# Patient Record
Sex: Male | Born: 1950 | Race: Asian | Hispanic: No | Marital: Married
Health system: Southern US, Community
[De-identification: ages and names within clinical notes are randomized; demographics above are authoritative.]

---

## 2000-10-02 ENCOUNTER — Encounter: Admission: RE | Admit: 2000-10-02 | Discharge: 2000-12-31 | Payer: Self-pay | Admitting: Internal Medicine

## 2014-01-30 ENCOUNTER — Other Ambulatory Visit (HOSPITAL_COMMUNITY): Payer: Self-pay | Admitting: Cardiology

## 2014-01-30 DIAGNOSIS — R071 Chest pain on breathing: Secondary | ICD-10-CM

## 2014-02-06 ENCOUNTER — Encounter (HOSPITAL_COMMUNITY)
Admission: RE | Admit: 2014-02-06 | Discharge: 2014-02-06 | Disposition: A | Discharge status: Home / Self Care | Payer: Managed Care, Other (non HMO) | Source: Ambulatory Visit | Attending: Cardiology | Admitting: Cardiology

## 2014-02-06 ENCOUNTER — Ambulatory Visit (HOSPITAL_COMMUNITY): Payer: Self-pay

## 2014-02-06 VITALS — BP 126/71 | HR 93

## 2014-02-06 DIAGNOSIS — R071 Chest pain on breathing: Secondary | ICD-10-CM

## 2014-02-06 MED ORDER — TECHNETIUM TC 99M SESTAMIBI GENERIC - CARDIOLITE
10.0000 | Freq: Once | INTRAVENOUS | Status: AC | PRN
  Administered 2014-02-06: 10 via INTRAVENOUS

## 2014-02-06 MED ORDER — TECHNETIUM TC 99M SESTAMIBI GENERIC - CARDIOLITE
30.0000 | Freq: Once | INTRAVENOUS | Status: AC | PRN
  Administered 2014-02-06: 30 via INTRAVENOUS

## 2015-01-28 ENCOUNTER — Other Ambulatory Visit: Payer: Self-pay | Admitting: Internal Medicine

## 2015-01-28 ENCOUNTER — Ambulatory Visit
Admission: RE | Admit: 2015-01-28 | Discharge: 2015-01-28 | Disposition: A | Discharge status: Home / Self Care | Payer: Managed Care, Other (non HMO) | Source: Ambulatory Visit | Attending: Internal Medicine | Admitting: Internal Medicine

## 2015-01-28 DIAGNOSIS — M25551 Pain in right hip: Secondary | ICD-10-CM

## 2015-01-28 IMAGING — CR DG HIP (WITH OR WITHOUT PELVIS) 2-3V*R*
2 series · 2 of 2 positions shown · non-contrast
Comparison: None.

CLINICAL DATA: Right hip pain, fall [DATE]

EXAM:
DG HIP (WITH OR WITHOUT PELVIS) 2-3V RIGHT

[w pelvis]
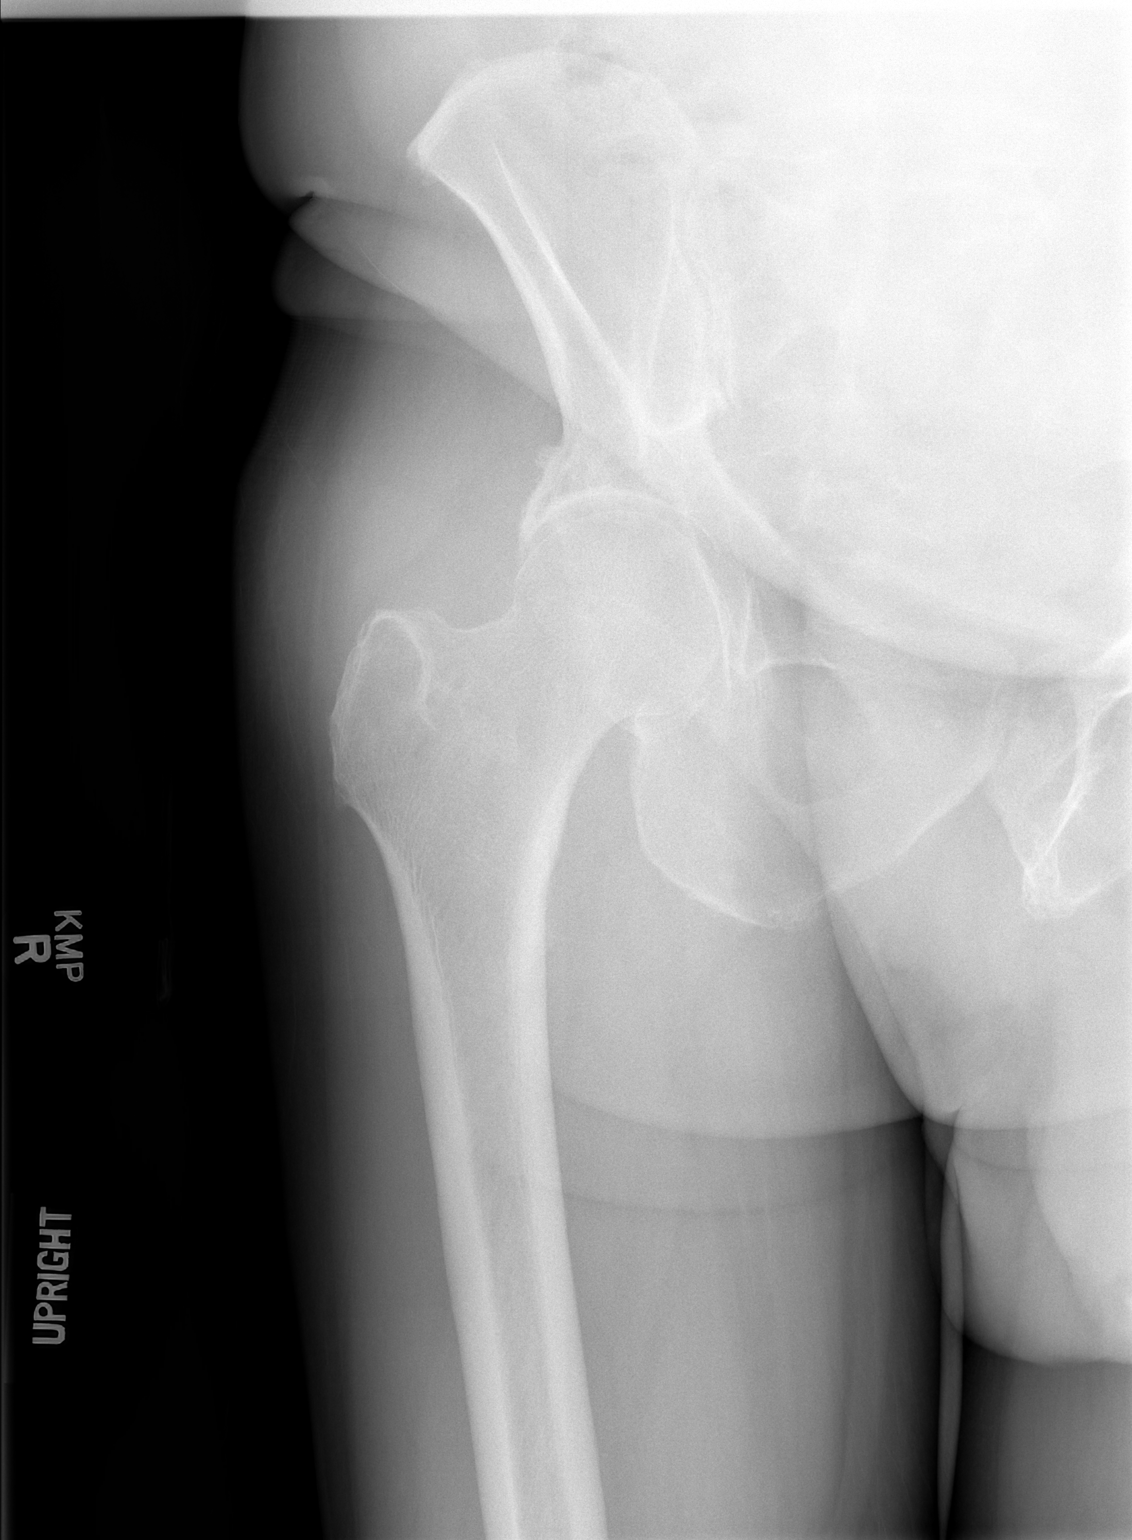

[t hip frog leg right]
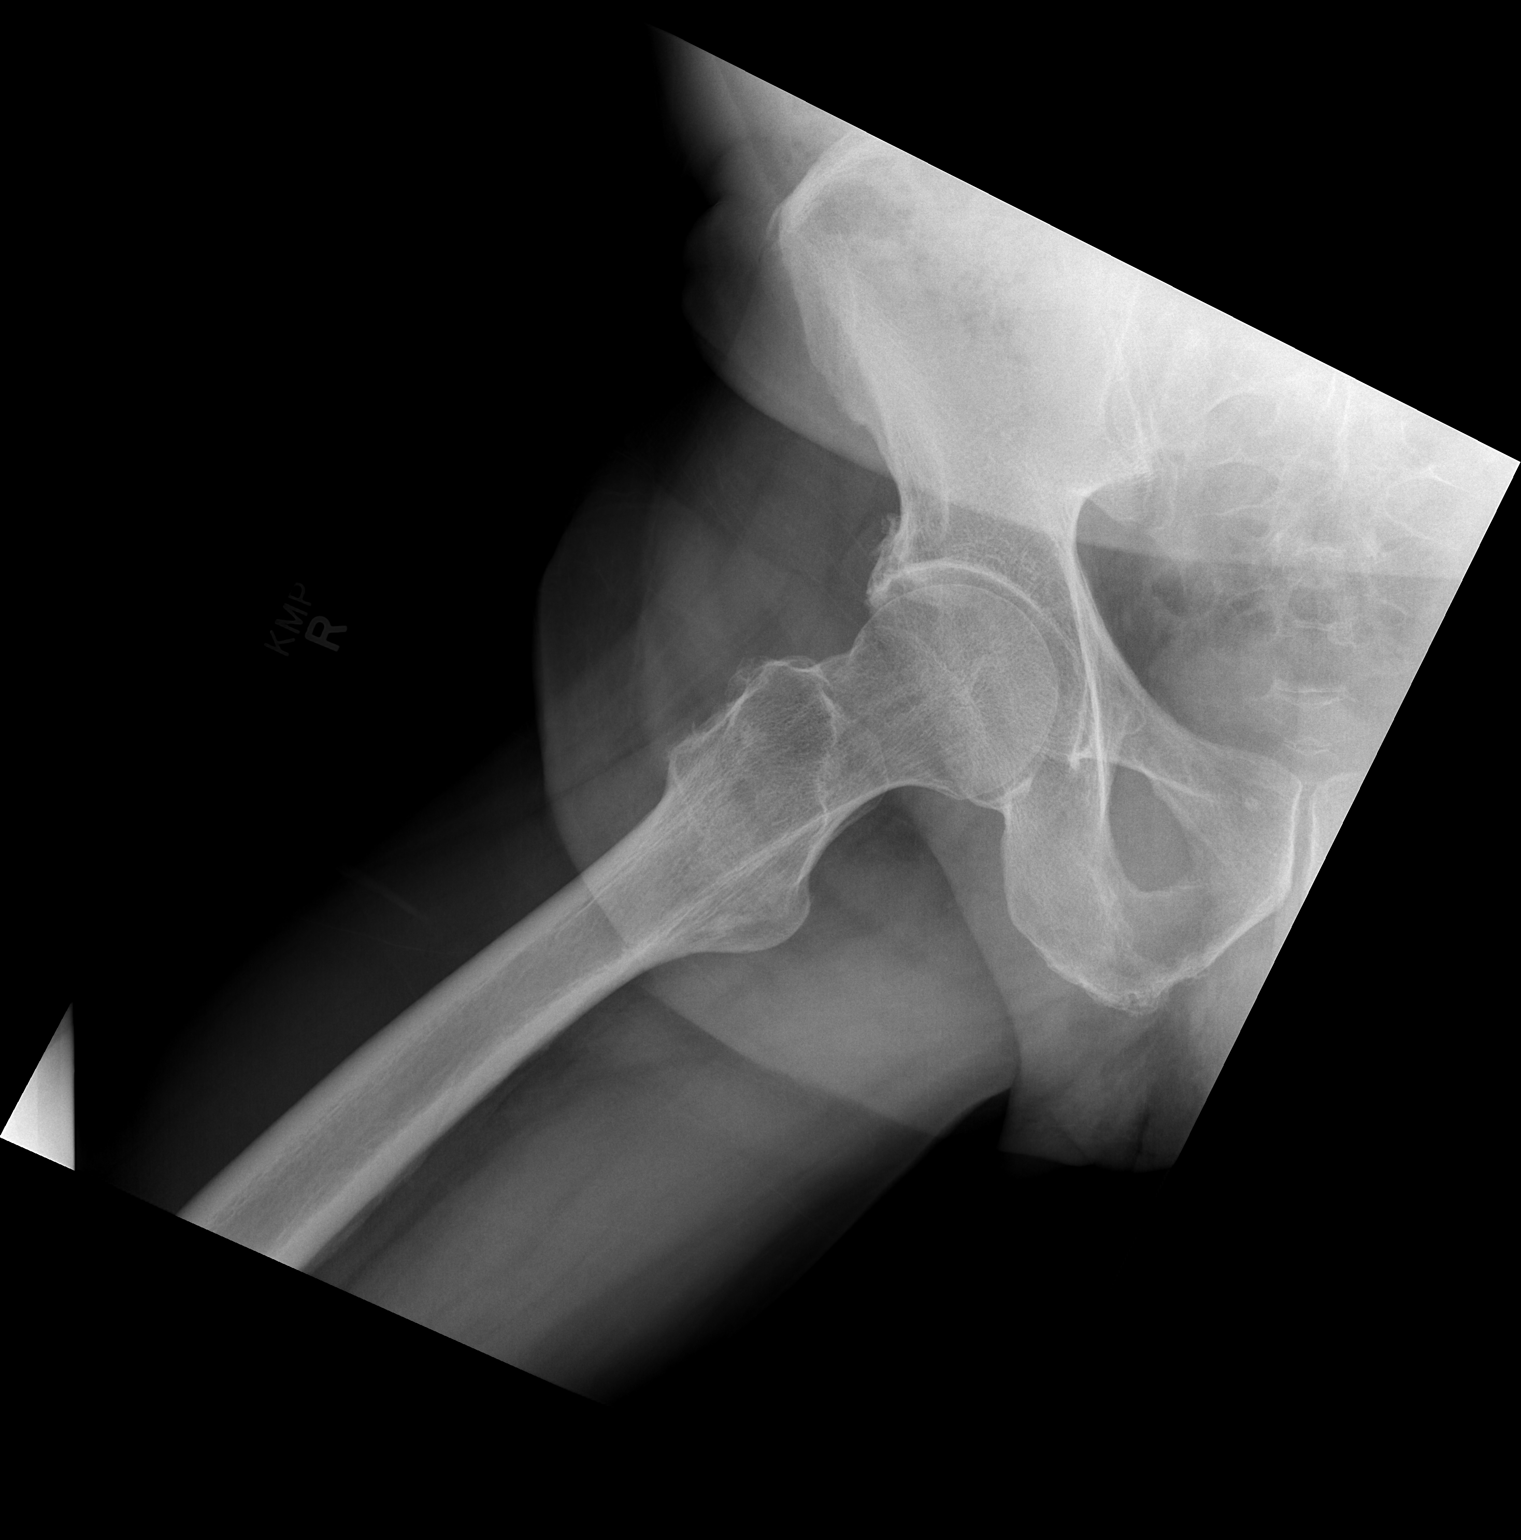

[2 of 2 positions shown; findings below may reference images not displayed]

FINDINGS: Two views of the right hip submitted. No acute fracture or
subluxation. Minimal superior acetabular spurring. Mild spurring of
greater femoral trochanter.
IMPRESSION: No acute fracture or subluxation.  Minimal degenerative changes.

## 2015-12-27 DIAGNOSIS — Z1389 Encounter for screening for other disorder: Secondary | ICD-10-CM | POA: Diagnosis not present

## 2015-12-27 DIAGNOSIS — Z0000 Encounter for general adult medical examination without abnormal findings: Secondary | ICD-10-CM | POA: Diagnosis not present

## 2015-12-27 DIAGNOSIS — Z23 Encounter for immunization: Secondary | ICD-10-CM | POA: Diagnosis not present

## 2015-12-27 DIAGNOSIS — E119 Type 2 diabetes mellitus without complications: Secondary | ICD-10-CM | POA: Diagnosis not present

## 2015-12-27 DIAGNOSIS — Z7984 Long term (current) use of oral hypoglycemic drugs: Secondary | ICD-10-CM | POA: Diagnosis not present

## 2015-12-27 DIAGNOSIS — E7800 Pure hypercholesterolemia, unspecified: Secondary | ICD-10-CM | POA: Diagnosis not present

## 2016-02-14 DIAGNOSIS — E113393 Type 2 diabetes mellitus with moderate nonproliferative diabetic retinopathy without macular edema, bilateral: Secondary | ICD-10-CM | POA: Diagnosis not present

## 2017-01-04 DIAGNOSIS — E1165 Type 2 diabetes mellitus with hyperglycemia: Secondary | ICD-10-CM | POA: Diagnosis not present

## 2017-01-04 DIAGNOSIS — K644 Residual hemorrhoidal skin tags: Secondary | ICD-10-CM | POA: Diagnosis not present

## 2017-01-04 DIAGNOSIS — Z125 Encounter for screening for malignant neoplasm of prostate: Secondary | ICD-10-CM | POA: Diagnosis not present

## 2017-01-04 DIAGNOSIS — Z7984 Long term (current) use of oral hypoglycemic drugs: Secondary | ICD-10-CM | POA: Diagnosis not present

## 2017-01-04 DIAGNOSIS — E119 Type 2 diabetes mellitus without complications: Secondary | ICD-10-CM | POA: Diagnosis not present

## 2017-01-04 DIAGNOSIS — Z0000 Encounter for general adult medical examination without abnormal findings: Secondary | ICD-10-CM | POA: Diagnosis not present

## 2017-01-04 DIAGNOSIS — N481 Balanitis: Secondary | ICD-10-CM | POA: Diagnosis not present

## 2017-01-04 DIAGNOSIS — I2510 Atherosclerotic heart disease of native coronary artery without angina pectoris: Secondary | ICD-10-CM | POA: Diagnosis not present

## 2017-01-04 DIAGNOSIS — Z1389 Encounter for screening for other disorder: Secondary | ICD-10-CM | POA: Diagnosis not present

## 2017-03-07 DIAGNOSIS — E113393 Type 2 diabetes mellitus with moderate nonproliferative diabetic retinopathy without macular edema, bilateral: Secondary | ICD-10-CM | POA: Diagnosis not present

## 2017-04-18 ENCOUNTER — Other Ambulatory Visit: Payer: Self-pay | Admitting: Pharmacy Technician

## 2017-04-18 NOTE — Patient Outreach (Signed)
[facility]) Care Management  04/18/2017  Christopher Howell May 23, 1950 [id number]  Incoming [employer] EMMI call in reference to medication adherence. HIPAA identifiers verified and verbal consent received. Patient states he takes all of his medications daily as prescribed with the exception of Invokana. He is currently in his coverage gap and cannot afford the medication so he takes it sparingly to get through the rest of the year. He has not asked his physician for samples but states he will call today to see if there is any available. He is testing his blood sugar as directed and states that it's normal with him taking Metformin and Glimepiride as prescribed.  Doreene Burke, [place] [phone]

## 2017-05-11 DIAGNOSIS — I2510 Atherosclerotic heart disease of native coronary artery without angina pectoris: Secondary | ICD-10-CM | POA: Diagnosis not present

## 2017-05-11 DIAGNOSIS — E119 Type 2 diabetes mellitus without complications: Secondary | ICD-10-CM | POA: Diagnosis not present

## 2017-08-23 ENCOUNTER — Other Ambulatory Visit: Payer: Self-pay | Admitting: Cardiology

## 2017-08-23 DIAGNOSIS — I10 Essential (primary) hypertension: Secondary | ICD-10-CM | POA: Diagnosis not present

## 2017-08-23 DIAGNOSIS — E119 Type 2 diabetes mellitus without complications: Secondary | ICD-10-CM | POA: Diagnosis not present

## 2017-08-23 DIAGNOSIS — E785 Hyperlipidemia, unspecified: Secondary | ICD-10-CM | POA: Diagnosis not present

## 2017-08-23 DIAGNOSIS — I2510 Atherosclerotic heart disease of native coronary artery without angina pectoris: Secondary | ICD-10-CM | POA: Diagnosis not present

## 2017-08-23 DIAGNOSIS — R079 Chest pain, unspecified: Secondary | ICD-10-CM

## 2017-08-31 ENCOUNTER — Encounter (HOSPITAL_COMMUNITY)
Admission: RE | Admit: 2017-08-31 | Discharge: 2017-08-31 | Disposition: A | Discharge status: Home / Self Care | Payer: PPO | Source: Ambulatory Visit | Attending: Cardiology | Admitting: Cardiology

## 2017-08-31 DIAGNOSIS — I10 Essential (primary) hypertension: Secondary | ICD-10-CM | POA: Diagnosis not present

## 2017-08-31 DIAGNOSIS — E119 Type 2 diabetes mellitus without complications: Secondary | ICD-10-CM | POA: Diagnosis not present

## 2017-08-31 DIAGNOSIS — R079 Chest pain, unspecified: Secondary | ICD-10-CM | POA: Diagnosis not present

## 2017-08-31 DIAGNOSIS — E785 Hyperlipidemia, unspecified: Secondary | ICD-10-CM | POA: Diagnosis not present

## 2017-08-31 DIAGNOSIS — I2510 Atherosclerotic heart disease of native coronary artery without angina pectoris: Secondary | ICD-10-CM | POA: Diagnosis not present

## 2017-08-31 MED ORDER — TECHNETIUM TC 99M TETROFOSMIN IV KIT
10.0000 | PACK | Freq: Once | INTRAVENOUS | Status: AC | PRN
  Administered 2017-08-31: 10 via INTRAVENOUS

## 2017-08-31 MED ORDER — TECHNETIUM TC 99M TETROFOSMIN IV KIT
30.0000 | PACK | Freq: Once | INTRAVENOUS | Status: AC | PRN
  Administered 2017-08-31: 30 via INTRAVENOUS

## 2017-09-03 DIAGNOSIS — K644 Residual hemorrhoidal skin tags: Secondary | ICD-10-CM | POA: Diagnosis not present

## 2017-09-03 DIAGNOSIS — I2510 Atherosclerotic heart disease of native coronary artery without angina pectoris: Secondary | ICD-10-CM | POA: Diagnosis not present

## 2017-09-03 DIAGNOSIS — R21 Rash and other nonspecific skin eruption: Secondary | ICD-10-CM | POA: Diagnosis not present

## 2017-09-03 DIAGNOSIS — E1169 Type 2 diabetes mellitus with other specified complication: Secondary | ICD-10-CM | POA: Diagnosis not present

## 2017-09-07 DIAGNOSIS — I10 Essential (primary) hypertension: Secondary | ICD-10-CM | POA: Diagnosis not present

## 2017-09-07 DIAGNOSIS — E119 Type 2 diabetes mellitus without complications: Secondary | ICD-10-CM | POA: Diagnosis not present

## 2017-09-07 DIAGNOSIS — I2510 Atherosclerotic heart disease of native coronary artery without angina pectoris: Secondary | ICD-10-CM | POA: Diagnosis not present

## 2017-09-07 DIAGNOSIS — E785 Hyperlipidemia, unspecified: Secondary | ICD-10-CM | POA: Diagnosis not present

## 2017-09-10 DIAGNOSIS — E1169 Type 2 diabetes mellitus with other specified complication: Secondary | ICD-10-CM | POA: Diagnosis not present

## 2017-09-13 ENCOUNTER — Ambulatory Visit (HOSPITAL_COMMUNITY)
Admission: RE | Admit: 2017-09-13 | Discharge: 2017-09-13 | Disposition: A | Discharge status: Home / Self Care | Payer: PPO | Source: Ambulatory Visit | Attending: Cardiology | Admitting: Cardiology

## 2017-09-13 ENCOUNTER — Ambulatory Visit (HOSPITAL_COMMUNITY)
Admission: RE | Disposition: A | Discharge status: Home / Self Care | Payer: Self-pay | Source: Ambulatory Visit | Attending: Cardiology

## 2017-09-13 DIAGNOSIS — Z7982 Long term (current) use of aspirin: Secondary | ICD-10-CM | POA: Insufficient documentation

## 2017-09-13 DIAGNOSIS — I2510 Atherosclerotic heart disease of native coronary artery without angina pectoris: Secondary | ICD-10-CM | POA: Diagnosis not present

## 2017-09-13 DIAGNOSIS — Z79899 Other long term (current) drug therapy: Secondary | ICD-10-CM | POA: Insufficient documentation

## 2017-09-13 DIAGNOSIS — R9439 Abnormal result of other cardiovascular function study: Secondary | ICD-10-CM | POA: Insufficient documentation

## 2017-09-13 DIAGNOSIS — E119 Type 2 diabetes mellitus without complications: Secondary | ICD-10-CM | POA: Insufficient documentation

## 2017-09-13 DIAGNOSIS — Z888 Allergy status to other drugs, medicaments and biological substances status: Secondary | ICD-10-CM | POA: Insufficient documentation

## 2017-09-13 DIAGNOSIS — E785 Hyperlipidemia, unspecified: Secondary | ICD-10-CM | POA: Insufficient documentation

## 2017-09-13 DIAGNOSIS — E669 Obesity, unspecified: Secondary | ICD-10-CM | POA: Diagnosis not present

## 2017-09-13 DIAGNOSIS — I10 Essential (primary) hypertension: Secondary | ICD-10-CM | POA: Insufficient documentation

## 2017-09-13 DIAGNOSIS — Z8249 Family history of ischemic heart disease and other diseases of the circulatory system: Secondary | ICD-10-CM | POA: Diagnosis not present

## 2017-09-13 DIAGNOSIS — Z955 Presence of coronary angioplasty implant and graft: Secondary | ICD-10-CM | POA: Insufficient documentation

## 2017-09-13 DIAGNOSIS — Z7984 Long term (current) use of oral hypoglycemic drugs: Secondary | ICD-10-CM | POA: Diagnosis not present

## 2017-09-13 DIAGNOSIS — R5383 Other fatigue: Secondary | ICD-10-CM | POA: Diagnosis not present

## 2017-09-13 DIAGNOSIS — R9431 Abnormal electrocardiogram [ECG] [EKG]: Secondary | ICD-10-CM | POA: Diagnosis not present

## 2017-09-13 DIAGNOSIS — Z6826 Body mass index (BMI) 26.0-26.9, adult: Secondary | ICD-10-CM | POA: Insufficient documentation

## 2017-09-13 HISTORY — PX: LEFT HEART CATH AND CORONARY ANGIOGRAPHY: CATH118249

## 2017-09-13 LAB — GLUCOSE, CAPILLARY
GLUCOSE-CAPILLARY: 145 mg/dL — AB (ref 65–99)
GLUCOSE-CAPILLARY: 121 mg/dL — AB (ref 65–99)

## 2017-09-13 SURGERY — LEFT HEART CATH AND CORONARY ANGIOGRAPHY
Anesthesia: LOCAL

## 2017-09-13 MED ORDER — SODIUM CHLORIDE 0.9 % WEIGHT BASED INFUSION
3.0000 mL/kg/h | INTRAVENOUS | Status: AC
  Administered 2017-09-13: 3 mL/kg/h via INTRAVENOUS

## 2017-09-13 MED ORDER — ASPIRIN 81 MG PO CHEW
CHEWABLE_TABLET | ORAL | Status: AC
  Administered 2017-09-13: 81 mg
  Filled 2017-09-13: qty 1

## 2017-09-13 MED ORDER — SODIUM CHLORIDE 0.9 % WEIGHT BASED INFUSION
1.0000 mL/kg/h | INTRAVENOUS | Status: DC
  Administered 2017-09-13: 250 mL via INTRAVENOUS

## 2017-09-13 MED ORDER — SODIUM CHLORIDE 0.9% FLUSH: 3.0000 mL | INTRAVENOUS | Status: DC | PRN

## 2017-09-13 MED ORDER — SODIUM CHLORIDE 0.9% FLUSH: 3.0000 mL | Freq: Two times a day (BID) | INTRAVENOUS | Status: DC

## 2017-09-13 MED ORDER — CLOPIDOGREL BISULFATE 75 MG PO TABS
ORAL_TABLET | ORAL | Status: AC
  Filled 2017-09-13: qty 1

## 2017-09-13 MED ORDER — SODIUM CHLORIDE 0.9 % IV SOLN: INTRAVENOUS | Status: AC

## 2017-09-13 MED ORDER — METFORMIN HCL 500 MG PO TABS: 1000.0000 mg | ORAL_TABLET | Freq: Two times a day (BID) | ORAL | 3 refills | Status: AC

## 2017-09-13 MED ORDER — ACETAMINOPHEN 325 MG PO TABS: 650.0000 mg | ORAL_TABLET | ORAL | Status: DC | PRN

## 2017-09-13 MED ORDER — SODIUM CHLORIDE 0.9 % IV SOLN: 250.0000 mL | INTRAVENOUS | Status: DC | PRN

## 2017-09-13 MED ORDER — HEPARIN (PORCINE) IN NACL 1000-0.9 UT/500ML-% IV SOLN
INTRAVENOUS | Status: AC
  Filled 2017-09-13: qty 1000

## 2017-09-13 MED ORDER — CLOPIDOGREL BISULFATE 75 MG PO TABS
75.0000 mg | ORAL_TABLET | Freq: Once | ORAL | Status: AC
  Administered 2017-09-13: 75 mg via ORAL

## 2017-09-13 MED ORDER — ONDANSETRON HCL 4 MG/2ML IJ SOLN: 4.0000 mg | Freq: Four times a day (QID) | INTRAMUSCULAR | Status: DC | PRN

## 2017-09-13 MED ORDER — FENTANYL CITRATE (PF) 100 MCG/2ML IJ SOLN
INTRAMUSCULAR | Status: AC
  Filled 2017-09-13: qty 2

## 2017-09-13 MED ORDER — LIDOCAINE HCL (PF) 1 % IJ SOLN
INTRAMUSCULAR | Status: AC
  Filled 2017-09-13: qty 30

## 2017-09-13 MED ORDER — HEPARIN (PORCINE) IN NACL 2-0.9 UNITS/ML
INTRAMUSCULAR | Status: AC | PRN
  Administered 2017-09-13 (×2): 500 mL via INTRA_ARTERIAL

## 2017-09-13 MED ORDER — IOHEXOL 350 MG/ML SOLN
INTRAVENOUS | Status: DC | PRN
  Administered 2017-09-13: 65 mL via INTRA_ARTERIAL

## 2017-09-13 MED ORDER — FENTANYL CITRATE (PF) 100 MCG/2ML IJ SOLN
INTRAMUSCULAR | Status: DC | PRN
  Administered 2017-09-13: 25 ug via INTRAVENOUS

## 2017-09-13 MED ORDER — ASPIRIN 81 MG PO CHEW: 81.0000 mg | CHEWABLE_TABLET | ORAL | Status: DC

## 2017-09-13 MED ORDER — METFORMIN HCL 500 MG PO TABS
1000.0000 mg | ORAL_TABLET | Freq: Two times a day (BID) | ORAL | Status: DC
  Filled 2017-09-13: qty 2

## 2017-09-13 MED ORDER — MIDAZOLAM HCL 2 MG/2ML IJ SOLN
INTRAMUSCULAR | Status: AC
  Filled 2017-09-13: qty 2

## 2017-09-13 MED ORDER — LIDOCAINE HCL (PF) 1 % IJ SOLN
INTRAMUSCULAR | Status: DC | PRN
  Administered 2017-09-13: 17 mL via INTRADERMAL

## 2017-09-13 MED ORDER — MIDAZOLAM HCL 2 MG/2ML IJ SOLN
INTRAMUSCULAR | Status: DC | PRN
  Administered 2017-09-13: 1 mg via INTRAVENOUS

## 2017-09-13 SURGICAL SUPPLY — 7 items
CATH INFINITI 5FR MULTPACK ANG (CATHETERS) ×1 IMPLANT
KIT HEART LEFT (KITS) ×2 IMPLANT
PACK CARDIAC CATHETERIZATION (CUSTOM PROCEDURE TRAY) ×2 IMPLANT
SHEATH PINNACLE 5F 10CM (SHEATH) ×1 IMPLANT
SYR MEDRAD MARK V 150ML (SYRINGE) ×2 IMPLANT
TRANSDUCER W/STOPCOCK (MISCELLANEOUS) ×2 IMPLANT
WIRE EMERALD 3MM-J .035X150CM (WIRE) ×2 IMPLANT

## 2017-09-13 NOTE — H&P (Signed)
Printed H&P in chart needs to be scanned

## 2017-09-13 NOTE — Interval H&P Note (Signed)
Cath Lab Visit (complete for each Cath Lab visit)  Clinical Evaluation Leading to the Procedure:   ACS: No.  Non-ACS:    Anginal Classification: CCS III  Anti-ischemic medical therapy: Maximal Therapy (2 or more classes of medications)  Non-Invasive Test Results: Intermediate-risk stress test findings: cardiac mortality 1-3%/year  Prior CABG: No previous CABG      History and Physical Interval Note:  09/13/2017 7:39 AM  Christopher Howell Needs  has presented today for surgery, with the diagnosis of abnormal stress test with cp  The various methods of treatment have been discussed with the patient and family. After consideration of risks, benefits and other options for treatment, the patient has consented to  Procedure(s): LEFT HEART CATH AND CORONARY ANGIOGRAPHY (N/A) as a surgical intervention .  The patient's history has been reviewed, patient examined, no change in status, stable for surgery.  I have reviewed the patient's chart and labs.  Questions were answered to the patient's satisfaction.     Christopher Howell

## 2017-09-13 NOTE — Discharge Instructions (Signed)
Coronary Angiogram With Stent °Coronary angiogram with stent placement is a procedure to widen or open a narrow blood vessel of the heart (coronary artery). Arteries may become blocked by cholesterol buildup (plaques) in the lining of the wall. When a coronary artery becomes partially blocked, blood flow to that area decreases. This may lead to chest pain or a heart attack (myocardial infarction). °A stent is a small piece of metal that looks like mesh or a spring. Stent placement may be done as treatment for a heart attack or right after a coronary angiogram in which a blocked artery is found. °Let your health care provider know about: °· Any allergies you have. °· All medicines you are taking, including vitamins, herbs, eye drops, creams, and over-the-counter medicines. °· Any problems you or family members have had with anesthetic medicines. °· Any blood disorders you have. °· Any surgeries you have had. °· Any medical conditions you have. °· Whether you are pregnant or may be pregnant. °What are the risks? °Generally, this is a safe procedure. However, problems may occur, including: °· Damage to the heart or its blood vessels. °· A return of blockage. °· Bleeding, infection, or bruising at the insertion site. °· A collection of blood under the skin (hematoma) at the insertion site. °· A blood clot in another part of the body. °· Kidney injury. °· Allergic reaction to the dye or contrast that is used. °· Bleeding into the abdomen (retroperitoneal bleeding). ° °What happens before the procedure? °Staying hydrated °Follow instructions from your health care provider about hydration, which may include: °· Up to 2 hours before the procedure - you may continue to drink clear liquids, such as water, clear fruit juice, black coffee, and plain tea. ° °Eating and drinking restrictions °Follow instructions from your health care provider about eating and drinking, which may include: °· 8 hours before the procedure - stop  eating heavy meals or foods such as meat, fried foods, or fatty foods. °· 6 hours before the procedure - stop eating light meals or foods, such as toast or cereal. °· 2 hours before the procedure - stop drinking clear liquids. ° °Ask your health care provider about: °· Changing or stopping your regular medicines. This is especially important if you are taking diabetes medicines or blood thinners. °· Taking medicines such as ibuprofen. These medicines can thin your blood. Do not take these medicines before your procedure if your health care provider instructs you not to. Generally, aspirin is recommended before a procedure of passing a small, thin tube (catheter) through a blood vessel and into the heart (cardiac catheterization). ° °What happens during the procedure? °· An IV tube will be inserted into one of your veins. °· You will be given one or more of the following: °? A medicine to help you relax (sedative). °? A medicine to numb the area where the catheter will be inserted into an artery (local anesthetic). °· To reduce your risk of infection: °? Your health care team will wash or sanitize their hands. °? Your skin will be washed with soap. °? Hair may be removed from the area where the catheter will be inserted. °· Using a guide wire, the catheter will be inserted into an artery. The location may be in your groin, in your wrist, or in the fold of your arm (near your elbow). °· A type of X-ray (fluoroscopy) will be used to help guide the catheter to the opening of the arteries in the heart. °·   A dye will be injected into the catheter, and X-rays will be taken. The dye will help to show where any narrowing or blockages are located in the arteries.  A tiny wire will be guided to the blocked spot, and a balloon will be inflated to make the artery wider.  The stent will be expanded and will crush the plaques into the wall of the vessel. The stent will hold the area open and improve the blood flow. Most stents  have a drug coating to reduce the risk of the stent narrowing over time.  The artery may be made wider using a drill, laser, or other tools to remove plaques.  When the blood flow is better, the catheter will be removed. The lining of the artery will grow over the stent, which stays where it was placed. This procedure may vary among health care providers and hospitals. What happens after the procedure?  If the procedure is done through the leg, you will be kept in bed lying flat for about 6 hours. You will be instructed to not bend and not cross your legs.  The insertion site will be checked frequently.  The pulse in your foot or wrist will be checked frequently.  You may have additional blood tests, X-rays, and a test that records the electrical activity of your heart (electrocardiogram, or ECG). This information is not intended to replace advice given to you by your health care provider. Make sure you discuss any questions you have with your health care provider. Document Released: 10/29/2002 Document Revised: 12/23/2015 Document Reviewed: 11/28/2015 Elsevier Interactive Patient Education  2018 [employer] metformin 09/15/17 Angiogram, Care After This sheet gives you information about how to care for yourself after your procedure. Your health care provider may also give you more specific instructions. If you have problems or questions, contact your health care provider. What can I expect after the procedure? After the procedure, it is common to have bruising and tenderness at the catheter insertion area. Follow these instructions at home: Insertion site care  Follow instructions from your health care provider about how to take care of your insertion site. Make sure you: ? Wash your hands with soap and water before you change your bandage (dressing). If soap and water are not available, use hand sanitizer. ? Change your dressing as told by your health care provider. ? Leave  stitches (sutures), skin glue, or adhesive strips in place. These skin closures may need to stay in place for 2 weeks or longer. If adhesive strip edges start to loosen and curl up, you may trim the loose edges. Do not remove adhesive strips completely unless your health care provider tells you to do that.  Do not take baths, swim, or use a hot tub until your health care provider approves.  You may shower 24-48 hours after the procedure or as told by your health care provider. ? Gently wash the site with plain soap and water. ? Pat the area dry with a clean towel. ? Do not rub the site. This may cause bleeding.  Do not apply powder or lotion to the site. Keep the site clean and dry.  Check your insertion site every day for signs of infection. Check for: ? Redness, swelling, or pain. ? Fluid or blood. ? Warmth. ? Pus or a bad smell. Activity  Rest as told by your health care provider, usually for 1-2 days.  Do not lift anything that is heavier than 10 lbs. (4.5 kg)  or as told by your health care provider.  Do not drive for 24 hours if you were given a medicine to help you relax (sedative).  Do not drive or use heavy machinery while taking prescription pain medicine. General instructions  Return to your normal activities as told by your health care provider, usually in about a week. Ask your health care provider what activities are safe for you.  If the catheter site starts bleeding, lie flat and put pressure on the site. If the bleeding does not stop, get help right away. This is a medical emergency.  Drink enough fluid to keep your urine clear or pale yellow. This helps flush the contrast dye from your body.  Take over-the-counter and prescription medicines only as told by your health care provider.  Keep all follow-up visits as told by your health care provider. This is important. Contact a health care provider if:  You have a fever or chills.  You have redness, swelling, or  pain around your insertion site.  You have fluid or blood coming from your insertion site.  The insertion site feels warm to the touch.  You have pus or a bad smell coming from your insertion site.  You have bruising around the insertion site.  You notice blood collecting in the tissue around the catheter site (hematoma). The hematoma may be painful to the touch. Get help right away if:  You have severe pain at the catheter insertion area.  The catheter insertion area swells very fast.  The catheter insertion area is bleeding, and the bleeding does not stop when you hold steady pressure on the area.  The area near or just beyond the catheter insertion site becomes pale, cool, tingly, or numb. These symptoms may represent a serious problem that is an emergency. Do not wait to see if the symptoms will go away. Get medical help right away. Call your local emergency services (911 in the [place] Do not drive yourself to the hospital. Summary  After the procedure, it is common to have bruising and tenderness at the catheter insertion area.  After the procedure, it is important to rest and drink plenty of fluids.  Do not take baths, swim, or use a hot tub until your health care provider says it is okay to do so. You may shower 24-48 hours after the procedure or as told by your health care provider.  If the catheter site starts bleeding, lie flat and put pressure on the site. If the bleeding does not stop, get help right away. This is a medical emergency. This information is not intended to replace advice given to you by your health care provider. Make sure you discuss any questions you have with your health care provider. Document Released: 11/10/2004 Document Revised: 03/29/2016 Document Reviewed: 03/29/2016 Elsevier Interactive Patient Education  [employer].

## 2017-09-13 NOTE — Progress Notes (Signed)
Site area: Right groin 5 french arterial sheath was removed  Site Prior to Removal:  Level 0  Pressure Applied For 20 MINUTES    Bedrest Beginning at 0840am  Manual:   Yes.    Patient Status During Pull:  stable  Post Pull Groin Site:  Level 0  Post Pull Instructions Given:  Yes.    Post Pull Pulses Present:  Yes.    Dressing Applied:  Yes.    Comments:  VS remain stable

## 2017-09-14 ENCOUNTER — Encounter (HOSPITAL_COMMUNITY): Payer: Self-pay | Admitting: Cardiology

## 2017-09-17 DIAGNOSIS — E119 Type 2 diabetes mellitus without complications: Secondary | ICD-10-CM | POA: Diagnosis not present

## 2017-09-17 DIAGNOSIS — K641 Second degree hemorrhoids: Secondary | ICD-10-CM | POA: Diagnosis not present

## 2017-09-17 DIAGNOSIS — I10 Essential (primary) hypertension: Secondary | ICD-10-CM | POA: Diagnosis not present

## 2017-09-17 DIAGNOSIS — Z7984 Long term (current) use of oral hypoglycemic drugs: Secondary | ICD-10-CM | POA: Diagnosis not present

## 2017-09-17 DIAGNOSIS — E785 Hyperlipidemia, unspecified: Secondary | ICD-10-CM | POA: Diagnosis not present

## 2017-09-17 DIAGNOSIS — I2510 Atherosclerotic heart disease of native coronary artery without angina pectoris: Secondary | ICD-10-CM | POA: Diagnosis not present

## 2017-09-18 MED FILL — Heparin Sod (Porcine)-NaCl IV Soln 1000 Unit/500ML-0.9%: INTRAVENOUS | Qty: 1000 | Status: AC

## 2017-09-27 DIAGNOSIS — Z7984 Long term (current) use of oral hypoglycemic drugs: Secondary | ICD-10-CM | POA: Diagnosis not present

## 2017-09-27 DIAGNOSIS — E119 Type 2 diabetes mellitus without complications: Secondary | ICD-10-CM | POA: Diagnosis not present

## 2017-11-02 DIAGNOSIS — K641 Second degree hemorrhoids: Secondary | ICD-10-CM | POA: Diagnosis not present

## 2018-01-28 DIAGNOSIS — Z1389 Encounter for screening for other disorder: Secondary | ICD-10-CM | POA: Diagnosis not present

## 2018-01-28 DIAGNOSIS — Z23 Encounter for immunization: Secondary | ICD-10-CM | POA: Diagnosis not present

## 2018-01-28 DIAGNOSIS — I2510 Atherosclerotic heart disease of native coronary artery without angina pectoris: Secondary | ICD-10-CM | POA: Diagnosis not present

## 2018-01-28 DIAGNOSIS — E7800 Pure hypercholesterolemia, unspecified: Secondary | ICD-10-CM | POA: Diagnosis not present

## 2018-01-28 DIAGNOSIS — E1169 Type 2 diabetes mellitus with other specified complication: Secondary | ICD-10-CM | POA: Diagnosis not present

## 2018-01-28 DIAGNOSIS — Z0000 Encounter for general adult medical examination without abnormal findings: Secondary | ICD-10-CM | POA: Diagnosis not present

## 2018-03-11 DIAGNOSIS — H25813 Combined forms of age-related cataract, bilateral: Secondary | ICD-10-CM | POA: Diagnosis not present

## 2018-03-11 DIAGNOSIS — E113393 Type 2 diabetes mellitus with moderate nonproliferative diabetic retinopathy without macular edema, bilateral: Secondary | ICD-10-CM | POA: Diagnosis not present

## 2018-03-22 DIAGNOSIS — I10 Essential (primary) hypertension: Secondary | ICD-10-CM | POA: Diagnosis not present

## 2018-03-22 DIAGNOSIS — E785 Hyperlipidemia, unspecified: Secondary | ICD-10-CM | POA: Diagnosis not present

## 2018-03-22 DIAGNOSIS — E119 Type 2 diabetes mellitus without complications: Secondary | ICD-10-CM | POA: Diagnosis not present

## 2018-03-22 DIAGNOSIS — I2510 Atherosclerotic heart disease of native coronary artery without angina pectoris: Secondary | ICD-10-CM | POA: Diagnosis not present

## 2018-09-20 DIAGNOSIS — E785 Hyperlipidemia, unspecified: Secondary | ICD-10-CM | POA: Diagnosis not present

## 2018-09-20 DIAGNOSIS — E119 Type 2 diabetes mellitus without complications: Secondary | ICD-10-CM | POA: Diagnosis not present

## 2018-09-20 DIAGNOSIS — I2510 Atherosclerotic heart disease of native coronary artery without angina pectoris: Secondary | ICD-10-CM | POA: Diagnosis not present

## 2018-09-20 DIAGNOSIS — I10 Essential (primary) hypertension: Secondary | ICD-10-CM | POA: Diagnosis not present

## 2018-11-06 DIAGNOSIS — E113393 Type 2 diabetes mellitus with moderate nonproliferative diabetic retinopathy without macular edema, bilateral: Secondary | ICD-10-CM | POA: Diagnosis not present

## 2019-01-30 DIAGNOSIS — Z0000 Encounter for general adult medical examination without abnormal findings: Secondary | ICD-10-CM | POA: Diagnosis not present

## 2019-01-30 DIAGNOSIS — E7800 Pure hypercholesterolemia, unspecified: Secondary | ICD-10-CM | POA: Diagnosis not present

## 2019-01-30 DIAGNOSIS — E119 Type 2 diabetes mellitus without complications: Secondary | ICD-10-CM | POA: Diagnosis not present

## 2019-01-30 DIAGNOSIS — Z125 Encounter for screening for malignant neoplasm of prostate: Secondary | ICD-10-CM | POA: Diagnosis not present

## 2019-01-30 DIAGNOSIS — I2510 Atherosclerotic heart disease of native coronary artery without angina pectoris: Secondary | ICD-10-CM | POA: Diagnosis not present

## 2019-01-30 DIAGNOSIS — Z1389 Encounter for screening for other disorder: Secondary | ICD-10-CM | POA: Diagnosis not present

## 2019-01-30 DIAGNOSIS — Z7984 Long term (current) use of oral hypoglycemic drugs: Secondary | ICD-10-CM | POA: Diagnosis not present

## 2019-03-11 DIAGNOSIS — E113393 Type 2 diabetes mellitus with moderate nonproliferative diabetic retinopathy without macular edema, bilateral: Secondary | ICD-10-CM | POA: Diagnosis not present

## 2019-03-17 DIAGNOSIS — E7800 Pure hypercholesterolemia, unspecified: Secondary | ICD-10-CM | POA: Diagnosis not present

## 2019-03-17 DIAGNOSIS — E119 Type 2 diabetes mellitus without complications: Secondary | ICD-10-CM | POA: Diagnosis not present

## 2019-03-17 DIAGNOSIS — I2510 Atherosclerotic heart disease of native coronary artery without angina pectoris: Secondary | ICD-10-CM | POA: Diagnosis not present

## 2019-03-17 DIAGNOSIS — E1169 Type 2 diabetes mellitus with other specified complication: Secondary | ICD-10-CM | POA: Diagnosis not present

## 2019-04-25 DIAGNOSIS — E785 Hyperlipidemia, unspecified: Secondary | ICD-10-CM | POA: Diagnosis not present

## 2019-04-25 DIAGNOSIS — I10 Essential (primary) hypertension: Secondary | ICD-10-CM | POA: Diagnosis not present

## 2019-04-25 DIAGNOSIS — E119 Type 2 diabetes mellitus without complications: Secondary | ICD-10-CM | POA: Diagnosis not present

## 2019-04-25 DIAGNOSIS — I2510 Atherosclerotic heart disease of native coronary artery without angina pectoris: Secondary | ICD-10-CM | POA: Diagnosis not present

## 2019-06-03 DIAGNOSIS — K5900 Constipation, unspecified: Secondary | ICD-10-CM | POA: Diagnosis not present

## 2019-06-03 DIAGNOSIS — E1169 Type 2 diabetes mellitus with other specified complication: Secondary | ICD-10-CM | POA: Diagnosis not present

## 2019-06-03 DIAGNOSIS — I10 Essential (primary) hypertension: Secondary | ICD-10-CM | POA: Diagnosis not present

## 2019-06-03 DIAGNOSIS — Z7984 Long term (current) use of oral hypoglycemic drugs: Secondary | ICD-10-CM | POA: Diagnosis not present

## 2019-06-05 DIAGNOSIS — E1169 Type 2 diabetes mellitus with other specified complication: Secondary | ICD-10-CM | POA: Diagnosis not present

## 2019-06-05 DIAGNOSIS — E119 Type 2 diabetes mellitus without complications: Secondary | ICD-10-CM | POA: Diagnosis not present

## 2019-06-05 DIAGNOSIS — I2510 Atherosclerotic heart disease of native coronary artery without angina pectoris: Secondary | ICD-10-CM | POA: Diagnosis not present

## 2019-06-05 DIAGNOSIS — E7800 Pure hypercholesterolemia, unspecified: Secondary | ICD-10-CM | POA: Diagnosis not present

## 2019-06-16 ENCOUNTER — Ambulatory Visit: Payer: PPO | Attending: Internal Medicine

## 2019-06-16 DIAGNOSIS — Z23 Encounter for immunization: Secondary | ICD-10-CM

## 2019-06-16 NOTE — Progress Notes (Signed)
   Covid-19 Vaccination Clinic  Name:  Christopher Howell    MRN: [id number] DOB: March 18, 1951  06/16/2019  Christopher Howell was observed post Covid-19 immunization for 15 minutes without incidence. He was provided with Vaccine Information Sheet and instruction to access the V-Safe system.   Christopher Howell was instructed to call 911 with any severe reactions post vaccine: Marland Kitchen Difficulty breathing  . Swelling of your face and throat  . A fast heartbeat  . A bad rash all over your body  . Dizziness and weakness    Immunizations Administered    Name Date Dose VIS Date Route   Pfizer COVID-19 Vaccine 06/16/2019  2:48 PM 0.3 mL 04/18/2019 Intramuscular   Manufacturer: [place]   Lot: [id number]   [place]: [id number]

## 2019-07-11 ENCOUNTER — Ambulatory Visit: Payer: PPO | Attending: Internal Medicine

## 2019-07-11 DIAGNOSIS — I2510 Atherosclerotic heart disease of native coronary artery without angina pectoris: Secondary | ICD-10-CM | POA: Diagnosis not present

## 2019-07-11 DIAGNOSIS — E7800 Pure hypercholesterolemia, unspecified: Secondary | ICD-10-CM | POA: Diagnosis not present

## 2019-07-11 DIAGNOSIS — E119 Type 2 diabetes mellitus without complications: Secondary | ICD-10-CM | POA: Diagnosis not present

## 2019-07-11 DIAGNOSIS — E1169 Type 2 diabetes mellitus with other specified complication: Secondary | ICD-10-CM | POA: Diagnosis not present

## 2019-07-11 DIAGNOSIS — Z23 Encounter for immunization: Secondary | ICD-10-CM | POA: Insufficient documentation

## 2019-07-11 NOTE — Progress Notes (Signed)
   Covid-19 Vaccination Clinic  Name:  Christopher Howell    MRN: [id number] DOB: November 08, 1950  07/11/2019  [name] was observed post Covid-19 immunization for 15 minutes without incident. He was provided with Vaccine Information Sheet and instruction to access the V-Safe system.   [name] was instructed to call 911 with any severe reactions post vaccine: Marland Kitchen Difficulty breathing  . Swelling of face and throat  . A fast heartbeat  . A bad rash all over body  . Dizziness and weakness   Immunizations Administered    Name Date Dose VIS Date Route   Pfizer COVID-19 Vaccine 07/11/2019  9:50 AM 0.3 mL 04/18/2019 Intramuscular   Manufacturer: [place]   Lot: [id number]   [place]: [id number]

## 2019-08-25 DIAGNOSIS — E7800 Pure hypercholesterolemia, unspecified: Secondary | ICD-10-CM | POA: Diagnosis not present

## 2019-08-25 DIAGNOSIS — E119 Type 2 diabetes mellitus without complications: Secondary | ICD-10-CM | POA: Diagnosis not present

## 2019-08-25 DIAGNOSIS — I2510 Atherosclerotic heart disease of native coronary artery without angina pectoris: Secondary | ICD-10-CM | POA: Diagnosis not present

## 2019-08-25 DIAGNOSIS — E1169 Type 2 diabetes mellitus with other specified complication: Secondary | ICD-10-CM | POA: Diagnosis not present

## 2019-09-15 DIAGNOSIS — E113393 Type 2 diabetes mellitus with moderate nonproliferative diabetic retinopathy without macular edema, bilateral: Secondary | ICD-10-CM | POA: Diagnosis not present

## 2019-10-02 DIAGNOSIS — Z1211 Encounter for screening for malignant neoplasm of colon: Secondary | ICD-10-CM | POA: Diagnosis not present

## 2019-10-02 DIAGNOSIS — E1169 Type 2 diabetes mellitus with other specified complication: Secondary | ICD-10-CM | POA: Diagnosis not present

## 2019-10-02 DIAGNOSIS — Z7984 Long term (current) use of oral hypoglycemic drugs: Secondary | ICD-10-CM | POA: Diagnosis not present

## 2019-10-24 DIAGNOSIS — E785 Hyperlipidemia, unspecified: Secondary | ICD-10-CM | POA: Diagnosis not present

## 2019-10-24 DIAGNOSIS — E119 Type 2 diabetes mellitus without complications: Secondary | ICD-10-CM | POA: Diagnosis not present

## 2019-10-24 DIAGNOSIS — I10 Essential (primary) hypertension: Secondary | ICD-10-CM | POA: Diagnosis not present

## 2019-10-24 DIAGNOSIS — I2510 Atherosclerotic heart disease of native coronary artery without angina pectoris: Secondary | ICD-10-CM | POA: Diagnosis not present

## 2020-02-05 DIAGNOSIS — Z23 Encounter for immunization: Secondary | ICD-10-CM | POA: Diagnosis not present

## 2020-02-05 DIAGNOSIS — E119 Type 2 diabetes mellitus without complications: Secondary | ICD-10-CM | POA: Diagnosis not present

## 2020-02-05 DIAGNOSIS — K5900 Constipation, unspecified: Secondary | ICD-10-CM | POA: Diagnosis not present

## 2020-02-05 DIAGNOSIS — I10 Essential (primary) hypertension: Secondary | ICD-10-CM | POA: Diagnosis not present

## 2020-02-05 DIAGNOSIS — E7800 Pure hypercholesterolemia, unspecified: Secondary | ICD-10-CM | POA: Diagnosis not present

## 2020-02-05 DIAGNOSIS — I2510 Atherosclerotic heart disease of native coronary artery without angina pectoris: Secondary | ICD-10-CM | POA: Diagnosis not present

## 2020-02-05 DIAGNOSIS — E1169 Type 2 diabetes mellitus with other specified complication: Secondary | ICD-10-CM | POA: Diagnosis not present

## 2020-02-05 DIAGNOSIS — Z0000 Encounter for general adult medical examination without abnormal findings: Secondary | ICD-10-CM | POA: Diagnosis not present

## 2020-02-05 DIAGNOSIS — Z1389 Encounter for screening for other disorder: Secondary | ICD-10-CM | POA: Diagnosis not present

## 2020-02-18 DIAGNOSIS — Z01812 Encounter for preprocedural laboratory examination: Secondary | ICD-10-CM | POA: Diagnosis not present

## 2020-02-19 DIAGNOSIS — K644 Residual hemorrhoidal skin tags: Secondary | ICD-10-CM | POA: Diagnosis not present

## 2020-02-19 DIAGNOSIS — Z1211 Encounter for screening for malignant neoplasm of colon: Secondary | ICD-10-CM | POA: Diagnosis not present

## 2020-02-19 DIAGNOSIS — D122 Benign neoplasm of ascending colon: Secondary | ICD-10-CM | POA: Diagnosis not present

## 2020-02-19 DIAGNOSIS — K648 Other hemorrhoids: Secondary | ICD-10-CM | POA: Diagnosis not present

## 2020-02-20 DIAGNOSIS — E119 Type 2 diabetes mellitus without complications: Secondary | ICD-10-CM | POA: Diagnosis not present

## 2020-02-20 DIAGNOSIS — I10 Essential (primary) hypertension: Secondary | ICD-10-CM | POA: Diagnosis not present

## 2020-02-20 DIAGNOSIS — E785 Hyperlipidemia, unspecified: Secondary | ICD-10-CM | POA: Diagnosis not present

## 2020-02-20 DIAGNOSIS — I2510 Atherosclerotic heart disease of native coronary artery without angina pectoris: Secondary | ICD-10-CM | POA: Diagnosis not present

## 2020-02-24 ENCOUNTER — Other Ambulatory Visit: Payer: Self-pay | Admitting: Cardiology

## 2020-02-24 ENCOUNTER — Ambulatory Visit
Admission: RE | Admit: 2020-02-24 | Discharge: 2020-02-24 | Disposition: A | Discharge status: Home / Self Care | Payer: PPO | Source: Ambulatory Visit | Attending: Cardiology | Admitting: Cardiology

## 2020-02-24 DIAGNOSIS — R634 Abnormal weight loss: Secondary | ICD-10-CM

## 2020-02-24 DIAGNOSIS — D122 Benign neoplasm of ascending colon: Secondary | ICD-10-CM | POA: Diagnosis not present

## 2020-04-19 DIAGNOSIS — E119 Type 2 diabetes mellitus without complications: Secondary | ICD-10-CM | POA: Diagnosis not present

## 2020-04-19 DIAGNOSIS — I2510 Atherosclerotic heart disease of native coronary artery without angina pectoris: Secondary | ICD-10-CM | POA: Diagnosis not present

## 2020-04-19 DIAGNOSIS — E1169 Type 2 diabetes mellitus with other specified complication: Secondary | ICD-10-CM | POA: Diagnosis not present

## 2020-04-19 DIAGNOSIS — I10 Essential (primary) hypertension: Secondary | ICD-10-CM | POA: Diagnosis not present

## 2020-04-19 DIAGNOSIS — E7800 Pure hypercholesterolemia, unspecified: Secondary | ICD-10-CM | POA: Diagnosis not present

## 2020-05-20 DIAGNOSIS — I10 Essential (primary) hypertension: Secondary | ICD-10-CM | POA: Diagnosis not present

## 2020-05-20 DIAGNOSIS — E119 Type 2 diabetes mellitus without complications: Secondary | ICD-10-CM | POA: Diagnosis not present

## 2020-05-20 DIAGNOSIS — E785 Hyperlipidemia, unspecified: Secondary | ICD-10-CM | POA: Diagnosis not present

## 2020-05-20 DIAGNOSIS — I2510 Atherosclerotic heart disease of native coronary artery without angina pectoris: Secondary | ICD-10-CM | POA: Diagnosis not present

## 2020-07-05 DIAGNOSIS — K5909 Other constipation: Secondary | ICD-10-CM | POA: Diagnosis not present

## 2020-07-05 DIAGNOSIS — I2510 Atherosclerotic heart disease of native coronary artery without angina pectoris: Secondary | ICD-10-CM | POA: Diagnosis not present

## 2020-07-05 DIAGNOSIS — Z7984 Long term (current) use of oral hypoglycemic drugs: Secondary | ICD-10-CM | POA: Diagnosis not present

## 2020-07-05 DIAGNOSIS — E119 Type 2 diabetes mellitus without complications: Secondary | ICD-10-CM | POA: Diagnosis not present

## 2020-07-05 DIAGNOSIS — I10 Essential (primary) hypertension: Secondary | ICD-10-CM | POA: Diagnosis not present

## 2020-07-20 DIAGNOSIS — E1169 Type 2 diabetes mellitus with other specified complication: Secondary | ICD-10-CM | POA: Diagnosis not present

## 2020-07-20 DIAGNOSIS — I10 Essential (primary) hypertension: Secondary | ICD-10-CM | POA: Diagnosis not present

## 2020-07-20 DIAGNOSIS — I2510 Atherosclerotic heart disease of native coronary artery without angina pectoris: Secondary | ICD-10-CM | POA: Diagnosis not present

## 2020-07-20 DIAGNOSIS — E119 Type 2 diabetes mellitus without complications: Secondary | ICD-10-CM | POA: Diagnosis not present

## 2020-07-20 DIAGNOSIS — E7800 Pure hypercholesterolemia, unspecified: Secondary | ICD-10-CM | POA: Diagnosis not present

## 2020-08-09 DIAGNOSIS — E113393 Type 2 diabetes mellitus with moderate nonproliferative diabetic retinopathy without macular edema, bilateral: Secondary | ICD-10-CM | POA: Diagnosis not present

## 2020-09-15 DIAGNOSIS — I2510 Atherosclerotic heart disease of native coronary artery without angina pectoris: Secondary | ICD-10-CM | POA: Diagnosis not present

## 2020-09-15 DIAGNOSIS — I10 Essential (primary) hypertension: Secondary | ICD-10-CM | POA: Diagnosis not present

## 2020-09-15 DIAGNOSIS — E785 Hyperlipidemia, unspecified: Secondary | ICD-10-CM | POA: Diagnosis not present

## 2020-09-15 DIAGNOSIS — E119 Type 2 diabetes mellitus without complications: Secondary | ICD-10-CM | POA: Diagnosis not present

## 2020-10-25 DIAGNOSIS — G8929 Other chronic pain: Secondary | ICD-10-CM | POA: Diagnosis not present

## 2020-10-25 DIAGNOSIS — I10 Essential (primary) hypertension: Secondary | ICD-10-CM | POA: Diagnosis not present

## 2020-10-25 DIAGNOSIS — Z7984 Long term (current) use of oral hypoglycemic drugs: Secondary | ICD-10-CM | POA: Diagnosis not present

## 2020-10-25 DIAGNOSIS — E1169 Type 2 diabetes mellitus with other specified complication: Secondary | ICD-10-CM | POA: Diagnosis not present

## 2020-10-25 DIAGNOSIS — M25511 Pain in right shoulder: Secondary | ICD-10-CM | POA: Diagnosis not present

## 2020-11-03 DIAGNOSIS — M67911 Unspecified disorder of synovium and tendon, right shoulder: Secondary | ICD-10-CM | POA: Diagnosis not present

## 2020-11-14 DIAGNOSIS — E119 Type 2 diabetes mellitus without complications: Secondary | ICD-10-CM | POA: Diagnosis not present

## 2020-11-14 DIAGNOSIS — I10 Essential (primary) hypertension: Secondary | ICD-10-CM | POA: Diagnosis not present

## 2020-11-14 DIAGNOSIS — G8929 Other chronic pain: Secondary | ICD-10-CM | POA: Diagnosis not present

## 2020-11-14 DIAGNOSIS — E1169 Type 2 diabetes mellitus with other specified complication: Secondary | ICD-10-CM | POA: Diagnosis not present

## 2020-11-14 DIAGNOSIS — I2510 Atherosclerotic heart disease of native coronary artery without angina pectoris: Secondary | ICD-10-CM | POA: Diagnosis not present

## 2020-11-14 DIAGNOSIS — E7800 Pure hypercholesterolemia, unspecified: Secondary | ICD-10-CM | POA: Diagnosis not present

## 2020-12-15 DIAGNOSIS — M67912 Unspecified disorder of synovium and tendon, left shoulder: Secondary | ICD-10-CM | POA: Diagnosis not present

## 2020-12-15 DIAGNOSIS — M67911 Unspecified disorder of synovium and tendon, right shoulder: Secondary | ICD-10-CM | POA: Diagnosis not present

## 2020-12-17 DIAGNOSIS — I2510 Atherosclerotic heart disease of native coronary artery without angina pectoris: Secondary | ICD-10-CM | POA: Diagnosis not present

## 2020-12-17 DIAGNOSIS — E785 Hyperlipidemia, unspecified: Secondary | ICD-10-CM | POA: Diagnosis not present

## 2020-12-17 DIAGNOSIS — I10 Essential (primary) hypertension: Secondary | ICD-10-CM | POA: Diagnosis not present

## 2020-12-17 DIAGNOSIS — E119 Type 2 diabetes mellitus without complications: Secondary | ICD-10-CM | POA: Diagnosis not present

## 2020-12-23 DIAGNOSIS — M7541 Impingement syndrome of right shoulder: Secondary | ICD-10-CM | POA: Diagnosis not present

## 2020-12-23 DIAGNOSIS — M19012 Primary osteoarthritis, left shoulder: Secondary | ICD-10-CM | POA: Diagnosis not present

## 2020-12-28 DIAGNOSIS — M19012 Primary osteoarthritis, left shoulder: Secondary | ICD-10-CM | POA: Diagnosis not present

## 2020-12-28 DIAGNOSIS — M7541 Impingement syndrome of right shoulder: Secondary | ICD-10-CM | POA: Diagnosis not present

## 2020-12-30 DIAGNOSIS — M7541 Impingement syndrome of right shoulder: Secondary | ICD-10-CM | POA: Diagnosis not present

## 2020-12-30 DIAGNOSIS — M19012 Primary osteoarthritis, left shoulder: Secondary | ICD-10-CM | POA: Diagnosis not present

## 2021-01-18 DIAGNOSIS — M7541 Impingement syndrome of right shoulder: Secondary | ICD-10-CM | POA: Diagnosis not present

## 2021-01-18 DIAGNOSIS — M19012 Primary osteoarthritis, left shoulder: Secondary | ICD-10-CM | POA: Diagnosis not present

## 2021-01-25 DIAGNOSIS — M7541 Impingement syndrome of right shoulder: Secondary | ICD-10-CM | POA: Diagnosis not present

## 2021-01-25 DIAGNOSIS — M19012 Primary osteoarthritis, left shoulder: Secondary | ICD-10-CM | POA: Diagnosis not present

## 2021-01-28 DIAGNOSIS — M25511 Pain in right shoulder: Secondary | ICD-10-CM | POA: Diagnosis not present

## 2021-01-28 DIAGNOSIS — M25512 Pain in left shoulder: Secondary | ICD-10-CM | POA: Diagnosis not present

## 2021-03-04 DIAGNOSIS — M25512 Pain in left shoulder: Secondary | ICD-10-CM | POA: Diagnosis not present

## 2021-03-04 DIAGNOSIS — M25511 Pain in right shoulder: Secondary | ICD-10-CM | POA: Diagnosis not present

## 2021-03-07 DIAGNOSIS — M25511 Pain in right shoulder: Secondary | ICD-10-CM | POA: Diagnosis not present

## 2021-03-08 DIAGNOSIS — E1169 Type 2 diabetes mellitus with other specified complication: Secondary | ICD-10-CM | POA: Diagnosis not present

## 2021-03-08 DIAGNOSIS — E113393 Type 2 diabetes mellitus with moderate nonproliferative diabetic retinopathy without macular edema, bilateral: Secondary | ICD-10-CM | POA: Diagnosis not present

## 2021-03-08 DIAGNOSIS — M12812 Other specific arthropathies, not elsewhere classified, left shoulder: Secondary | ICD-10-CM | POA: Diagnosis not present

## 2021-03-08 DIAGNOSIS — K5900 Constipation, unspecified: Secondary | ICD-10-CM | POA: Diagnosis not present

## 2021-03-08 DIAGNOSIS — E7800 Pure hypercholesterolemia, unspecified: Secondary | ICD-10-CM | POA: Diagnosis not present

## 2021-03-08 DIAGNOSIS — M12811 Other specific arthropathies, not elsewhere classified, right shoulder: Secondary | ICD-10-CM | POA: Diagnosis not present

## 2021-03-08 DIAGNOSIS — Z23 Encounter for immunization: Secondary | ICD-10-CM | POA: Diagnosis not present

## 2021-03-08 DIAGNOSIS — I2510 Atherosclerotic heart disease of native coronary artery without angina pectoris: Secondary | ICD-10-CM | POA: Diagnosis not present

## 2021-03-08 DIAGNOSIS — Z1389 Encounter for screening for other disorder: Secondary | ICD-10-CM | POA: Diagnosis not present

## 2021-03-08 DIAGNOSIS — Z0000 Encounter for general adult medical examination without abnormal findings: Secondary | ICD-10-CM | POA: Diagnosis not present

## 2021-03-22 DIAGNOSIS — I2510 Atherosclerotic heart disease of native coronary artery without angina pectoris: Secondary | ICD-10-CM | POA: Diagnosis not present

## 2021-03-22 DIAGNOSIS — E119 Type 2 diabetes mellitus without complications: Secondary | ICD-10-CM | POA: Diagnosis not present

## 2021-03-22 DIAGNOSIS — E7800 Pure hypercholesterolemia, unspecified: Secondary | ICD-10-CM | POA: Diagnosis not present

## 2021-03-22 DIAGNOSIS — G8929 Other chronic pain: Secondary | ICD-10-CM | POA: Diagnosis not present

## 2021-03-22 DIAGNOSIS — I10 Essential (primary) hypertension: Secondary | ICD-10-CM | POA: Diagnosis not present

## 2021-03-22 DIAGNOSIS — E1169 Type 2 diabetes mellitus with other specified complication: Secondary | ICD-10-CM | POA: Diagnosis not present

## 2021-03-29 DIAGNOSIS — M948X1 Other specified disorders of cartilage, shoulder: Secondary | ICD-10-CM | POA: Diagnosis not present

## 2021-03-29 DIAGNOSIS — M7541 Impingement syndrome of right shoulder: Secondary | ICD-10-CM | POA: Diagnosis not present

## 2021-03-29 DIAGNOSIS — G8918 Other acute postprocedural pain: Secondary | ICD-10-CM | POA: Diagnosis not present

## 2021-03-29 DIAGNOSIS — M24111 Other articular cartilage disorders, right shoulder: Secondary | ICD-10-CM | POA: Diagnosis not present

## 2021-03-29 DIAGNOSIS — M7531 Calcific tendinitis of right shoulder: Secondary | ICD-10-CM | POA: Diagnosis not present

## 2021-03-29 DIAGNOSIS — M7551 Bursitis of right shoulder: Secondary | ICD-10-CM | POA: Diagnosis not present

## 2021-04-07 DIAGNOSIS — M25611 Stiffness of right shoulder, not elsewhere classified: Secondary | ICD-10-CM | POA: Diagnosis not present

## 2021-04-07 DIAGNOSIS — M7551 Bursitis of right shoulder: Secondary | ICD-10-CM | POA: Diagnosis not present

## 2021-04-12 DIAGNOSIS — M7551 Bursitis of right shoulder: Secondary | ICD-10-CM | POA: Diagnosis not present

## 2021-04-12 DIAGNOSIS — M25611 Stiffness of right shoulder, not elsewhere classified: Secondary | ICD-10-CM | POA: Diagnosis not present

## 2021-04-14 DIAGNOSIS — M7551 Bursitis of right shoulder: Secondary | ICD-10-CM | POA: Diagnosis not present

## 2021-04-14 DIAGNOSIS — M25611 Stiffness of right shoulder, not elsewhere classified: Secondary | ICD-10-CM | POA: Diagnosis not present

## 2021-04-18 DIAGNOSIS — M25611 Stiffness of right shoulder, not elsewhere classified: Secondary | ICD-10-CM | POA: Diagnosis not present

## 2021-04-18 DIAGNOSIS — M7551 Bursitis of right shoulder: Secondary | ICD-10-CM | POA: Diagnosis not present

## 2021-04-20 DIAGNOSIS — M25611 Stiffness of right shoulder, not elsewhere classified: Secondary | ICD-10-CM | POA: Diagnosis not present

## 2021-04-20 DIAGNOSIS — M7551 Bursitis of right shoulder: Secondary | ICD-10-CM | POA: Diagnosis not present

## 2021-04-22 DIAGNOSIS — E785 Hyperlipidemia, unspecified: Secondary | ICD-10-CM | POA: Diagnosis not present

## 2021-04-22 DIAGNOSIS — E119 Type 2 diabetes mellitus without complications: Secondary | ICD-10-CM | POA: Diagnosis not present

## 2021-04-22 DIAGNOSIS — I10 Essential (primary) hypertension: Secondary | ICD-10-CM | POA: Diagnosis not present

## 2021-04-22 DIAGNOSIS — R0789 Other chest pain: Secondary | ICD-10-CM | POA: Diagnosis not present

## 2021-04-22 DIAGNOSIS — I2510 Atherosclerotic heart disease of native coronary artery without angina pectoris: Secondary | ICD-10-CM | POA: Diagnosis not present

## 2021-11-11 DIAGNOSIS — I2510 Atherosclerotic heart disease of native coronary artery without angina pectoris: Secondary | ICD-10-CM | POA: Diagnosis not present

## 2021-11-11 DIAGNOSIS — I10 Essential (primary) hypertension: Secondary | ICD-10-CM | POA: Diagnosis not present

## 2021-11-11 DIAGNOSIS — E119 Type 2 diabetes mellitus without complications: Secondary | ICD-10-CM | POA: Diagnosis not present

## 2021-11-11 DIAGNOSIS — E785 Hyperlipidemia, unspecified: Secondary | ICD-10-CM | POA: Diagnosis not present

## 2022-02-10 DIAGNOSIS — I2510 Atherosclerotic heart disease of native coronary artery without angina pectoris: Secondary | ICD-10-CM | POA: Diagnosis not present

## 2022-02-10 DIAGNOSIS — E119 Type 2 diabetes mellitus without complications: Secondary | ICD-10-CM | POA: Diagnosis not present

## 2022-02-10 DIAGNOSIS — E785 Hyperlipidemia, unspecified: Secondary | ICD-10-CM | POA: Diagnosis not present

## 2022-02-10 DIAGNOSIS — I10 Essential (primary) hypertension: Secondary | ICD-10-CM | POA: Diagnosis not present

## 2022-02-28 DIAGNOSIS — E113393 Type 2 diabetes mellitus with moderate nonproliferative diabetic retinopathy without macular edema, bilateral: Secondary | ICD-10-CM | POA: Diagnosis not present

## 2022-03-07 DIAGNOSIS — H34832 Tributary (branch) retinal vein occlusion, left eye, with macular edema: Secondary | ICD-10-CM | POA: Diagnosis not present

## 2022-03-07 DIAGNOSIS — H43823 Vitreomacular adhesion, bilateral: Secondary | ICD-10-CM | POA: Diagnosis not present

## 2022-03-07 DIAGNOSIS — H35033 Hypertensive retinopathy, bilateral: Secondary | ICD-10-CM | POA: Diagnosis not present

## 2022-03-07 DIAGNOSIS — E119 Type 2 diabetes mellitus without complications: Secondary | ICD-10-CM | POA: Diagnosis not present

## 2022-03-09 DIAGNOSIS — Z23 Encounter for immunization: Secondary | ICD-10-CM | POA: Diagnosis not present

## 2022-03-09 DIAGNOSIS — Z79899 Other long term (current) drug therapy: Secondary | ICD-10-CM | POA: Diagnosis not present

## 2022-03-09 DIAGNOSIS — I10 Essential (primary) hypertension: Secondary | ICD-10-CM | POA: Diagnosis not present

## 2022-03-09 DIAGNOSIS — Z125 Encounter for screening for malignant neoplasm of prostate: Secondary | ICD-10-CM | POA: Diagnosis not present

## 2022-03-09 DIAGNOSIS — K219 Gastro-esophageal reflux disease without esophagitis: Secondary | ICD-10-CM | POA: Diagnosis not present

## 2022-03-09 DIAGNOSIS — D126 Benign neoplasm of colon, unspecified: Secondary | ICD-10-CM | POA: Diagnosis not present

## 2022-03-09 DIAGNOSIS — E1169 Type 2 diabetes mellitus with other specified complication: Secondary | ICD-10-CM | POA: Diagnosis not present

## 2022-03-09 DIAGNOSIS — Z1331 Encounter for screening for depression: Secondary | ICD-10-CM | POA: Diagnosis not present

## 2022-03-09 DIAGNOSIS — I2510 Atherosclerotic heart disease of native coronary artery without angina pectoris: Secondary | ICD-10-CM | POA: Diagnosis not present

## 2022-03-09 DIAGNOSIS — Z0000 Encounter for general adult medical examination without abnormal findings: Secondary | ICD-10-CM | POA: Diagnosis not present

## 2022-03-21 DIAGNOSIS — H34832 Tributary (branch) retinal vein occlusion, left eye, with macular edema: Secondary | ICD-10-CM | POA: Diagnosis not present

## 2022-04-18 DIAGNOSIS — H34832 Tributary (branch) retinal vein occlusion, left eye, with macular edema: Secondary | ICD-10-CM | POA: Diagnosis not present

## 2022-07-14 DIAGNOSIS — H34832 Tributary (branch) retinal vein occlusion, left eye, with macular edema: Secondary | ICD-10-CM | POA: Diagnosis not present

## 2022-08-11 DIAGNOSIS — I2510 Atherosclerotic heart disease of native coronary artery without angina pectoris: Secondary | ICD-10-CM | POA: Diagnosis not present

## 2022-08-11 DIAGNOSIS — H34832 Tributary (branch) retinal vein occlusion, left eye, with macular edema: Secondary | ICD-10-CM | POA: Diagnosis not present

## 2022-08-11 DIAGNOSIS — E782 Mixed hyperlipidemia: Secondary | ICD-10-CM | POA: Diagnosis not present

## 2022-08-11 DIAGNOSIS — I10 Essential (primary) hypertension: Secondary | ICD-10-CM | POA: Diagnosis not present

## 2022-08-11 DIAGNOSIS — E119 Type 2 diabetes mellitus without complications: Secondary | ICD-10-CM | POA: Diagnosis not present

## 2022-08-28 DIAGNOSIS — H353131 Nonexudative age-related macular degeneration, bilateral, early dry stage: Secondary | ICD-10-CM | POA: Diagnosis not present

## 2022-09-06 DIAGNOSIS — E1169 Type 2 diabetes mellitus with other specified complication: Secondary | ICD-10-CM | POA: Diagnosis not present

## 2022-09-06 DIAGNOSIS — E119 Type 2 diabetes mellitus without complications: Secondary | ICD-10-CM | POA: Diagnosis not present

## 2022-09-06 DIAGNOSIS — I2510 Atherosclerotic heart disease of native coronary artery without angina pectoris: Secondary | ICD-10-CM | POA: Diagnosis not present

## 2022-09-06 DIAGNOSIS — I10 Essential (primary) hypertension: Secondary | ICD-10-CM | POA: Diagnosis not present

## 2022-09-06 DIAGNOSIS — K219 Gastro-esophageal reflux disease without esophagitis: Secondary | ICD-10-CM | POA: Diagnosis not present

## 2022-09-19 DIAGNOSIS — H43823 Vitreomacular adhesion, bilateral: Secondary | ICD-10-CM | POA: Diagnosis not present

## 2022-09-19 DIAGNOSIS — H35033 Hypertensive retinopathy, bilateral: Secondary | ICD-10-CM | POA: Diagnosis not present

## 2022-09-19 DIAGNOSIS — H34832 Tributary (branch) retinal vein occlusion, left eye, with macular edema: Secondary | ICD-10-CM | POA: Diagnosis not present

## 2022-10-09 DIAGNOSIS — H25813 Combined forms of age-related cataract, bilateral: Secondary | ICD-10-CM | POA: Diagnosis not present

## 2022-11-03 DIAGNOSIS — H35033 Hypertensive retinopathy, bilateral: Secondary | ICD-10-CM | POA: Diagnosis not present

## 2022-11-03 DIAGNOSIS — H34832 Tributary (branch) retinal vein occlusion, left eye, with macular edema: Secondary | ICD-10-CM | POA: Diagnosis not present

## 2022-11-03 DIAGNOSIS — H43823 Vitreomacular adhesion, bilateral: Secondary | ICD-10-CM | POA: Diagnosis not present

## 2022-12-29 DIAGNOSIS — H2513 Age-related nuclear cataract, bilateral: Secondary | ICD-10-CM | POA: Diagnosis not present

## 2022-12-29 DIAGNOSIS — H34832 Tributary (branch) retinal vein occlusion, left eye, with macular edema: Secondary | ICD-10-CM | POA: Diagnosis not present

## 2022-12-29 DIAGNOSIS — H43823 Vitreomacular adhesion, bilateral: Secondary | ICD-10-CM | POA: Diagnosis not present

## 2022-12-29 DIAGNOSIS — H35033 Hypertensive retinopathy, bilateral: Secondary | ICD-10-CM | POA: Diagnosis not present

## 2023-01-02 DIAGNOSIS — H2511 Age-related nuclear cataract, right eye: Secondary | ICD-10-CM | POA: Diagnosis not present

## 2023-01-02 DIAGNOSIS — H34832 Tributary (branch) retinal vein occlusion, left eye, with macular edema: Secondary | ICD-10-CM | POA: Diagnosis not present

## 2023-01-02 DIAGNOSIS — H2513 Age-related nuclear cataract, bilateral: Secondary | ICD-10-CM | POA: Diagnosis not present

## 2023-01-02 DIAGNOSIS — H18413 Arcus senilis, bilateral: Secondary | ICD-10-CM | POA: Diagnosis not present

## 2023-01-02 DIAGNOSIS — H25013 Cortical age-related cataract, bilateral: Secondary | ICD-10-CM | POA: Diagnosis not present

## 2023-02-09 DIAGNOSIS — E782 Mixed hyperlipidemia: Secondary | ICD-10-CM | POA: Diagnosis not present

## 2023-02-09 DIAGNOSIS — E119 Type 2 diabetes mellitus without complications: Secondary | ICD-10-CM | POA: Diagnosis not present

## 2023-02-09 DIAGNOSIS — I2510 Atherosclerotic heart disease of native coronary artery without angina pectoris: Secondary | ICD-10-CM | POA: Diagnosis not present

## 2023-02-09 DIAGNOSIS — I10 Essential (primary) hypertension: Secondary | ICD-10-CM | POA: Diagnosis not present

## 2023-03-12 DIAGNOSIS — H2511 Age-related nuclear cataract, right eye: Secondary | ICD-10-CM | POA: Diagnosis not present

## 2023-03-13 DIAGNOSIS — H2512 Age-related nuclear cataract, left eye: Secondary | ICD-10-CM | POA: Diagnosis not present

## 2023-03-14 DIAGNOSIS — K9089 Other intestinal malabsorption: Secondary | ICD-10-CM | POA: Diagnosis not present

## 2023-03-14 DIAGNOSIS — I2510 Atherosclerotic heart disease of native coronary artery without angina pectoris: Secondary | ICD-10-CM | POA: Diagnosis not present

## 2023-03-14 DIAGNOSIS — I10 Essential (primary) hypertension: Secondary | ICD-10-CM | POA: Diagnosis not present

## 2023-03-14 DIAGNOSIS — E538 Deficiency of other specified B group vitamins: Secondary | ICD-10-CM | POA: Diagnosis not present

## 2023-03-14 DIAGNOSIS — E1169 Type 2 diabetes mellitus with other specified complication: Secondary | ICD-10-CM | POA: Diagnosis not present

## 2023-03-14 DIAGNOSIS — K219 Gastro-esophageal reflux disease without esophagitis: Secondary | ICD-10-CM | POA: Diagnosis not present

## 2023-03-14 DIAGNOSIS — Z125 Encounter for screening for malignant neoplasm of prostate: Secondary | ICD-10-CM | POA: Diagnosis not present

## 2023-03-14 DIAGNOSIS — E119 Type 2 diabetes mellitus without complications: Secondary | ICD-10-CM | POA: Diagnosis not present

## 2023-03-14 DIAGNOSIS — Z79899 Other long term (current) drug therapy: Secondary | ICD-10-CM | POA: Diagnosis not present

## 2023-03-14 DIAGNOSIS — Z23 Encounter for immunization: Secondary | ICD-10-CM | POA: Diagnosis not present

## 2023-03-14 DIAGNOSIS — Z0000 Encounter for general adult medical examination without abnormal findings: Secondary | ICD-10-CM | POA: Diagnosis not present

## 2023-03-14 DIAGNOSIS — D696 Thrombocytopenia, unspecified: Secondary | ICD-10-CM | POA: Diagnosis not present

## 2023-03-16 DIAGNOSIS — H43823 Vitreomacular adhesion, bilateral: Secondary | ICD-10-CM | POA: Diagnosis not present

## 2023-03-16 DIAGNOSIS — H35033 Hypertensive retinopathy, bilateral: Secondary | ICD-10-CM | POA: Diagnosis not present

## 2023-03-16 DIAGNOSIS — H34832 Tributary (branch) retinal vein occlusion, left eye, with macular edema: Secondary | ICD-10-CM | POA: Diagnosis not present

## 2023-03-26 DIAGNOSIS — H2512 Age-related nuclear cataract, left eye: Secondary | ICD-10-CM | POA: Diagnosis not present

## 2023-03-26 DIAGNOSIS — H25042 Posterior subcapsular polar age-related cataract, left eye: Secondary | ICD-10-CM | POA: Diagnosis not present

## 2023-03-26 DIAGNOSIS — H25012 Cortical age-related cataract, left eye: Secondary | ICD-10-CM | POA: Diagnosis not present

## 2023-04-30 DIAGNOSIS — Z79899 Other long term (current) drug therapy: Secondary | ICD-10-CM | POA: Diagnosis not present

## 2023-04-30 DIAGNOSIS — E538 Deficiency of other specified B group vitamins: Secondary | ICD-10-CM | POA: Diagnosis not present

## 2023-05-07 DIAGNOSIS — H34832 Tributary (branch) retinal vein occlusion, left eye, with macular edema: Secondary | ICD-10-CM | POA: Diagnosis not present

## 2023-05-07 DIAGNOSIS — H43823 Vitreomacular adhesion, bilateral: Secondary | ICD-10-CM | POA: Diagnosis not present

## 2023-05-07 DIAGNOSIS — H35033 Hypertensive retinopathy, bilateral: Secondary | ICD-10-CM | POA: Diagnosis not present

## 2023-05-07 DIAGNOSIS — Z961 Presence of intraocular lens: Secondary | ICD-10-CM | POA: Diagnosis not present

## 2023-06-15 DIAGNOSIS — J449 Chronic obstructive pulmonary disease, unspecified: Secondary | ICD-10-CM | POA: Diagnosis not present

## 2023-06-15 DIAGNOSIS — I10 Essential (primary) hypertension: Secondary | ICD-10-CM | POA: Diagnosis not present

## 2023-06-15 DIAGNOSIS — E782 Mixed hyperlipidemia: Secondary | ICD-10-CM | POA: Diagnosis not present

## 2023-06-15 DIAGNOSIS — I2510 Atherosclerotic heart disease of native coronary artery without angina pectoris: Secondary | ICD-10-CM | POA: Diagnosis not present

## 2023-06-19 DIAGNOSIS — E113393 Type 2 diabetes mellitus with moderate nonproliferative diabetic retinopathy without macular edema, bilateral: Secondary | ICD-10-CM | POA: Diagnosis not present

## 2023-07-04 DIAGNOSIS — K648 Other hemorrhoids: Secondary | ICD-10-CM | POA: Diagnosis not present

## 2023-07-04 DIAGNOSIS — Z09 Encounter for follow-up examination after completed treatment for conditions other than malignant neoplasm: Secondary | ICD-10-CM | POA: Diagnosis not present

## 2023-07-04 DIAGNOSIS — D122 Benign neoplasm of ascending colon: Secondary | ICD-10-CM | POA: Diagnosis not present

## 2023-07-04 DIAGNOSIS — Z860101 Personal history of adenomatous and serrated colon polyps: Secondary | ICD-10-CM | POA: Diagnosis not present

## 2023-07-13 DIAGNOSIS — Z961 Presence of intraocular lens: Secondary | ICD-10-CM | POA: Diagnosis not present

## 2023-07-13 DIAGNOSIS — H34832 Tributary (branch) retinal vein occlusion, left eye, with macular edema: Secondary | ICD-10-CM | POA: Diagnosis not present

## 2023-07-13 DIAGNOSIS — H35033 Hypertensive retinopathy, bilateral: Secondary | ICD-10-CM | POA: Diagnosis not present

## 2023-07-13 DIAGNOSIS — E119 Type 2 diabetes mellitus without complications: Secondary | ICD-10-CM | POA: Diagnosis not present

## 2023-07-13 DIAGNOSIS — H43823 Vitreomacular adhesion, bilateral: Secondary | ICD-10-CM | POA: Diagnosis not present

## 2023-08-27 DIAGNOSIS — H34832 Tributary (branch) retinal vein occlusion, left eye, with macular edema: Secondary | ICD-10-CM | POA: Diagnosis not present

## 2023-09-07 DIAGNOSIS — R42 Dizziness and giddiness: Secondary | ICD-10-CM | POA: Diagnosis not present

## 2023-09-07 DIAGNOSIS — I2510 Atherosclerotic heart disease of native coronary artery without angina pectoris: Secondary | ICD-10-CM | POA: Diagnosis not present

## 2023-09-07 DIAGNOSIS — I10 Essential (primary) hypertension: Secondary | ICD-10-CM | POA: Diagnosis not present

## 2023-09-07 DIAGNOSIS — E119 Type 2 diabetes mellitus without complications: Secondary | ICD-10-CM | POA: Diagnosis not present

## 2023-09-13 DIAGNOSIS — H8113 Benign paroxysmal vertigo, bilateral: Secondary | ICD-10-CM | POA: Diagnosis not present

## 2023-09-13 DIAGNOSIS — E1169 Type 2 diabetes mellitus with other specified complication: Secondary | ICD-10-CM | POA: Diagnosis not present

## 2023-09-13 DIAGNOSIS — K5909 Other constipation: Secondary | ICD-10-CM | POA: Diagnosis not present

## 2023-09-13 DIAGNOSIS — I10 Essential (primary) hypertension: Secondary | ICD-10-CM | POA: Diagnosis not present

## 2023-09-13 DIAGNOSIS — K219 Gastro-esophageal reflux disease without esophagitis: Secondary | ICD-10-CM | POA: Diagnosis not present

## 2023-10-08 DIAGNOSIS — H34832 Tributary (branch) retinal vein occlusion, left eye, with macular edema: Secondary | ICD-10-CM | POA: Diagnosis not present

## 2023-10-08 DIAGNOSIS — H43823 Vitreomacular adhesion, bilateral: Secondary | ICD-10-CM | POA: Diagnosis not present

## 2023-10-08 DIAGNOSIS — Z961 Presence of intraocular lens: Secondary | ICD-10-CM | POA: Diagnosis not present

## 2023-10-08 DIAGNOSIS — H35033 Hypertensive retinopathy, bilateral: Secondary | ICD-10-CM | POA: Diagnosis not present

## 2023-10-08 DIAGNOSIS — E119 Type 2 diabetes mellitus without complications: Secondary | ICD-10-CM | POA: Diagnosis not present

## 2023-10-10 DIAGNOSIS — R630 Anorexia: Secondary | ICD-10-CM | POA: Diagnosis not present

## 2023-10-10 DIAGNOSIS — I2510 Atherosclerotic heart disease of native coronary artery without angina pectoris: Secondary | ICD-10-CM | POA: Diagnosis not present

## 2023-10-10 DIAGNOSIS — H8113 Benign paroxysmal vertigo, bilateral: Secondary | ICD-10-CM | POA: Diagnosis not present

## 2023-10-19 ENCOUNTER — Ambulatory Visit: Attending: Internal Medicine

## 2023-10-19 VITALS — BP 110/68 | HR 81

## 2023-10-19 DIAGNOSIS — R42 Dizziness and giddiness: Secondary | ICD-10-CM | POA: Diagnosis not present

## 2023-10-19 DIAGNOSIS — R2681 Unsteadiness on feet: Secondary | ICD-10-CM | POA: Insufficient documentation

## 2023-10-19 NOTE — Therapy (Signed)
 OUTPATIENT PHYSICAL THERAPY VESTIBULAR EVALUATION     Patient Name: Christopher Howell MRN: [id number] DOB:Feb 10, 1951, 73 y.o., male Today's Date: 10/19/2023  END OF SESSION:  PT End of Session - 10/19/23 1401     Visit Number 1    Number of Visits 9    Date for PT Re-Evaluation 11/16/23    Authorization Type [employer] medicare    PT Start Time 1400    PT Stop Time 1445    PT Time Calculation (min) 45 min    Activity Tolerance Patient tolerated treatment well    Behavior During Therapy [facility] for tasks assessed/performed          History reviewed. No pertinent past medical history. Past Surgical History:  Procedure Laterality Date   LEFT HEART CATH AND CORONARY ANGIOGRAPHY N/A 09/13/2017   Procedure: LEFT HEART CATH AND CORONARY ANGIOGRAPHY;  Surgeon: Chapman Commodore, MD;  Location: [facility] INVASIVE CV LAB;  Service: Cardiovascular;  Laterality: N/A;   There are no active problems to display for this patient.   PCP: Tressia Fry, MD REFERRING PROVIDER: Tressia Fry, MD  REFERRING DIAG:   [phone] (ICD-10-CM) - Benign paroxysmal vertigo, bilateral    THERAPY DIAG:  Unsteadiness on feet - Plan: PT plan of care cert/re-cert  Dizziness and giddiness - Plan: PT plan of care cert/re-cert  ONSET DATE: 10/11/23 referral  Rationale for Evaluation and Treatment: Rehabilitation  SUBJECTIVE:   SUBJECTIVE STATEMENT: Patient arrives to clinic alone, no AD. Has been having sporadic dizziness since he had cataract sx last year. At times he will have a HA and feel nauseous. A few times he has had to pull the car over. Reports mostly with bending down and certain head movements. This lasts <5 mins.  Pt accompanied by: self  PERTINENT HISTORY: L heart cath, B cataract sx  PAIN:  Are you having pain? No  PRECAUTIONS: Fall   WEIGHT BEARING RESTRICTIONS: No  FALLS: Has patient fallen in last 6 months? No  LIVING ENVIRONMENT: Lives with: lives with their family Lives in: House/apartment Stairs:  Yes: Internal: flight steps; on right going up and External: 10 steps; on right going up Has following equipment at home: Single point cane, Walker - 2 wheeled, shower chair, and Grab bars  PLOF: Independent  PATIENT GOALS: to figure out what's happening  OBJECTIVE:  Note: Objective measures were completed at Evaluation unless otherwise noted.  DIAGNOSTIC FINDINGS: N/A  COGNITION: Overall cognitive status: Within functional limits for tasks assessed   SENSATION: WFL   POSTURE:  rounded shoulders and forward head  Cervical ROM:   WFL, pain free  STRENGTH: WFL  BED MOBILITY:  Independent, sporadic dizziness with sitting up  GAIT: Gait pattern: WFL   VESTIBULAR ASSESSMENT:  GENERAL OBSERVATION: NAD, no AD   SYMPTOM BEHAVIOR:  Subjective history: see above  Non-Vestibular symptoms: changes in vision, headaches, and nausea/vomiting blurred vision  Type of dizziness: Spinning/Vertigo and World moves  Frequency: at least 1x a day  Duration: <5 mins  Aggravating factors: Induced by position change: supine to sit, Induced by motion: bending down to the ground, turning head quickly, and driving, and Worse in the morning  Relieving factors: rest  Progression of symptoms: unchanged  OCULOMOTOR EXAM:  Ocular Alignment: normal progressive glasses  Ocular ROM: No Limitations  Spontaneous Nystagmus: absent  Gaze-Induced Nystagmus: absent  Smooth Pursuits: intact reports 1-2/5 dizziness  Saccades: intact and rates 1-2/5 dizziness in vertical plane  Convergence/Divergence: ~8 cm, L suppressed    VESTIBULAR -  OCULAR REFLEX:   Slow VOR: Normal  VOR Cancellation: Normal  Head-Impulse Test: HIT Right: negative HIT Left: negative did report mild dizziness    POSITIONAL TESTING: Right Dix-Hallpike: no nystagmus Left Dix-Hallpike: no nystagmus Right Roll Test: no nystagmus Left Roll Test: no nystagmus  MOTION SENSITIVITY:  Motion Sensitivity Quotient Intensity: 0 =  none, 1 = Lightheaded, 2 = Mild, 3 = Moderate, 4 = Severe, 5 = Vomiting  Intensity  1. Sitting to supine 0  2. Supine to L side 0  3. Supine to R side 0  4. Supine to sitting 1  5. L Hallpike-Dix 0  6. Up from L  0  7. R Hallpike-Dix 0  8. Up from R  0  9. Sitting, head tipped to L knee 0  10. Head up from L knee 0  11. Sitting, head tipped to R knee 0  12. Head up from R knee 0  13. Sitting head turns x5 0  14.Sitting head nods x5 1  15. In stance, 180 turn to L  0  16. In stance, 180 turn to R 1       MCTSIB: Condition 1: 30s Condition 2: 30s Condition 3: 30s Condition 4: 30s                                                                                                                        TREATMENT Self care/home management: -ddx of vestibular dysfunction -influence of vision on balance  PATIENT EDUCATION: Education details: PT POC, exam findings, see above Person educated: Patient Education method: Explanation Education comprehension: verbalized understanding and needs further education  HOME EXERCISE PROGRAM:  GOALS: Goals reviewed with patient? Yes  SHORT TERM GOALS: = LTG based on PT POC length   LONG TERM GOALS: Target date: 11/16/23  Pt will be independent with final HEP for improved symptoms  Baseline: to be provided PRN Goal status: INITIAL  2.  Vestibular goal to be added PRN Baseline: to be assessed Goal status: INITIAL   ASSESSMENT:  CLINICAL IMPRESSION: Patient is a 73 y.o. male who was seen today for physical therapy evaluation and treatment for dizziness. His vestibular exam is grossly normal with the exception of mild motion sensitivity. He does describe a sensation of spinning vertigo, which would likely be consistent with BPPV- however, positional exam was normal on eval. He does have a cardiac hx as well as semi-recent eye surgery. He reports onset of s/s after eye sx, which could, theoretically be the etiology of motion sensitivity,  but likely not the sense of spinning.  He scored WNL on the M-CTSIB. He would benefit from further assessment of his vestibular system at least 1 additional time for further clarify any vestibular involvement.   OBJECTIVE IMPAIRMENTS: dizziness.   ACTIVITY LIMITATIONS: bending, bed mobility, locomotion level, and caring for others  PARTICIPATION LIMITATIONS: interpersonal relationship, driving, shopping, community activity, and occupation  PERSONAL FACTORS: Age, Past/current experiences, and Time since onset of injury/illness/exacerbation are  also affecting patient's functional outcome.   REHAB POTENTIAL: Fair unknown etiology  CLINICAL DECISION MAKING: Unstable/unpredictable  EVALUATION COMPLEXITY: High   PLAN:  PT FREQUENCY: 1-2x/week  PT DURATION: 4 weeks  PLANNED INTERVENTIONS: 16109- PT Re-evaluation, 97750- Physical Performance Testing, 97110-Therapeutic exercises, 97530- Therapeutic activity, [id number]- Neuromuscular re-education, 97535- Self Care, 60454- Manual therapy, [phone]- Gait training, [phone]- Orthotic Initial, [phone]- Orthotic/Prosthetic subsequent, [phone]- Canalith repositioning, [phone]- Aquatic Therapy, Patient/Family education, Balance training, Stair training, Vestibular training, Visual/preceptual remediation/compensation, Cognitive remediation, and DME instructions  PLAN FOR NEXT SESSION: re-check for BPPV, if vestib exam remains normal, d/c and refer patient back to Dr. Candi Chafe (already discussed this with patient)    Rebecca Campus, PT Rebecca Campus, PT, DPT, CBIS  10/19/2023, 3:38 PM

## 2023-10-22 ENCOUNTER — Encounter: Payer: Self-pay | Admitting: Physical Therapy

## 2023-10-22 ENCOUNTER — Ambulatory Visit: Payer: Self-pay | Admitting: Physical Therapy

## 2023-10-22 VITALS — BP 102/63 | HR 84

## 2023-10-22 DIAGNOSIS — R42 Dizziness and giddiness: Secondary | ICD-10-CM

## 2023-10-22 DIAGNOSIS — R2681 Unsteadiness on feet: Secondary | ICD-10-CM

## 2023-10-22 NOTE — Therapy (Signed)
 OUTPATIENT PHYSICAL THERAPY VESTIBULAR TREATMENT     Patient Name: Christopher Howell MRN: [id number] DOB:1950/08/03, 73 y.o., male Today's Date: 10/22/2023  END OF SESSION:  PT End of Session - 10/22/23 1447     Visit Number 2    Number of Visits 9    Date for PT Re-Evaluation 11/16/23    Authorization Type [employer] medicare    PT Start Time 1445    PT Stop Time 1515   full time not used due to D/C   PT Time Calculation (min) 30 min    Activity Tolerance Patient tolerated treatment well    Behavior During Therapy [facility] for tasks assessed/performed          History reviewed. No pertinent past medical history. Past Surgical History:  Procedure Laterality Date   LEFT HEART CATH AND CORONARY ANGIOGRAPHY N/A 09/13/2017   Procedure: LEFT HEART CATH AND CORONARY ANGIOGRAPHY;  Surgeon: Chapman Commodore, MD;  Location: [facility];  Service: Cardiovascular;  Laterality: N/A;   There are no active problems to display for this patient.   PCP: Tressia Fry, MD REFERRING PROVIDER: Tressia Fry, MD  REFERRING DIAG:   [phone] (ICD-10-CM) - Benign paroxysmal vertigo, bilateral    THERAPY DIAG:  Unsteadiness on feet  Dizziness and giddiness  ONSET DATE: 10/11/23 referral  Rationale for Evaluation and Treatment: Rehabilitation  SUBJECTIVE:   SUBJECTIVE STATEMENT: No changes since he was last here. Still feels a lot of pressure in his head and his eyes. Has not seen an ENT. Goes to a retina center and a regular doctor. Thinks this whole thing started after his cataract. Getting injections in his L eye every 6 months. Feels fine right now other than a little headache and a stuffed up feeling in his eyes and forehead. Has tried multiple movements since he was here on Friday and nothing has triggered it.   Pt accompanied by: self  PERTINENT HISTORY: L heart cath, B cataract sx  PAIN:  Are you having pain? No  PRECAUTIONS: Fall   WEIGHT BEARING RESTRICTIONS: No  FALLS: Has patient  fallen in last 6 months? No  LIVING ENVIRONMENT: Lives with: lives with their family Lives in: House/apartment Stairs: Yes: Internal: flight steps; on right going up and External: 10 steps; on right going up Has following equipment at home: Single point cane, Walker - 2 wheeled, shower chair, and Grab bars  PLOF: Independent  PATIENT GOALS: to figure out what's happening  OBJECTIVE:  Note: Objective measures were completed at Evaluation unless otherwise noted.  DIAGNOSTIC FINDINGS: N/A  COGNITION: Overall cognitive status: Within functional limits for tasks assessed   SENSATION: WFL   POSTURE:  rounded shoulders and forward head  Cervical ROM:   WFL, pain free  STRENGTH: WFL  BED MOBILITY:  Independent, sporadic dizziness with sitting up  GAIT: Gait pattern: WFL   VESTIBULAR ASSESSMENT:  GENERAL OBSERVATION: NAD, no AD   SYMPTOM BEHAVIOR:  Subjective history: see above  Non-Vestibular symptoms: changes in vision, headaches, and nausea/vomiting blurred vision  Type of dizziness: Spinning/Vertigo and World moves  Frequency: at least 1x a day  Duration: <5 mins  Aggravating factors: Induced by position change: supine to sit, Induced by motion: bending down to the ground, turning head quickly, and driving, and Worse in the morning  Relieving factors: rest  Progression of symptoms: unchanged  OCULOMOTOR EXAM:  Ocular Alignment: normal progressive glasses  Ocular ROM: No Limitations  Spontaneous Nystagmus: absent  Gaze-Induced Nystagmus: absent  Smooth  Pursuits: intact reports 1-2/5 dizziness  Saccades: intact and rates 1-2/5 dizziness in vertical plane  Convergence/Divergence: ~8 cm, L suppressed    VESTIBULAR - OCULAR REFLEX:   Slow VOR: Normal  VOR Cancellation: Normal  Head-Impulse Test: HIT Right: negative HIT Left: negative did report mild dizziness    POSITIONAL TESTING: Right Dix-Hallpike: no nystagmus Left Dix-Hallpike: no nystagmus Right  Roll Test: no nystagmus Left Roll Test: no nystagmus  MOTION SENSITIVITY:  Motion Sensitivity Quotient Intensity: 0 = none, 1 = Lightheaded, 2 = Mild, 3 = Moderate, 4 = Severe, 5 = Vomiting  Intensity  1. Sitting to supine 0  2. Supine to L side 0  3. Supine to R side 0  4. Supine to sitting 1  5. L Hallpike-Dix 0  6. Up from L  0  7. R Hallpike-Dix 0  8. Up from R  0  9. Sitting, head tipped to L knee 0  10. Head up from L knee 0  11. Sitting, head tipped to R knee 0  12. Head up from R knee 0  13. Sitting head turns x5 0  14.Sitting head nods x5 1  15. In stance, 180 turn to L  0  16. In stance, 180 turn to R 1       MCTSIB: Condition 1: 30s Condition 2: 30s Condition 3: 30s Condition 4: 30s                                                                                                                        TREATMENT: 10/22/23:   Therapeutic Activity: Vitals:   10/22/23 1452  BP: 102/63  Pulse: 84   POSITIONAL TESTING: Right Dix-Hallpike: no nystagmus and reports feeling a little spinning dizziness when coming upright Left Dix-Hallpike: no nystagmus Right Roll Test: no nystagmus and reports blurry vision Left Roll Test: no nystagmus Right Sidelying: no nystagmus and no dizziness when returning upright  Left Sidelying: no nystagmus and reports feeling a little blurry, when coming upright feels a little shaky   Smooth pursuits: WNL, no dizziness  Saccades: WNL, no dizziness   NMR: Gaze Adaptation: x1 Viewing Horizontal: Position: Standing, Time: 30 seconds, Reps: 2, and Comment: no dizziness x1 Viewing Vertical:  Position: Standing, Time: 30 seconds, Reps: 1, and Comment: no dizziness    Kendall Pauls down to L sidelying > sit, 5 reps, feeling like something is moving in his head, reports 2/10, reports feeling a little blurry when coming back up. Discussed pt can try working on this at home to see if it helps with habituation with L sidelying > sit. Pt stated  he would try and does not need handout for this.   PATIENT EDUCATION: Education details: exam findings and pt negative for BPPV, discussed that pt's vestibular exam is grossly normal and does not need skilled PT at this time as unable to reproduce pt's dizziness. Discussed trying Flonase (as this was instructed by pt's PCP as pt has not tried  this yet), following up with PCP and asking about an ENT referral due to feelings of ear fullness and fullness behind eyes and sinuses? Pt in agreement with plan  Person educated: Patient Education method: Explanation and Demonstration Education comprehension: verbalized understanding and needs further education  HOME EXERCISE PROGRAM: Sideling > sit on L side to try for habituation   PHYSICAL THERAPY DISCHARGE SUMMARY  Visits from Start of Care: 2  Current functional level related to goals / functional outcomes: See clinical assessment statement    Remaining deficits: Very mild motion sensitivity with L sidelying > sit that does not respond to [employer] in session   Education / Equipment: Following back up with PCP and asking about ENT referral    Patient agrees to discharge. No goals were written as skilled vestibular PT is not needed.    GOALS: Goals reviewed with patient? Yes  SHORT TERM GOALS: = LTG based on PT POC length   LONG TERM GOALS: Target date: 11/16/23  Pt will be independent with final HEP for improved symptoms  Baseline: not needed  Goal status: N/A  2.  Vestibular goal to be added PRN Baseline: to be assessed Goal status:N/A   ASSESSMENT:  CLINICAL IMPRESSION: Pt returns for further assessment of vestibular system after eval. Pt negative for BPPV and has normal oculomotor testing. Attempted VOR x1 exercises in standing with pt reporting no dizziness when performing.Pt has slight dizziness with L sidelying > sit with no change noted with habituation Kendall Pauls exercises. Pt reports onset of his symptoms after  his eye surgery and has been having close follow up with his eye doctors. Pt does not need skilled PT at this time as pt with grossly normal vestibular assessment and unable to produce pt's dizziness. Discussed following up with his PCP and getting a referral to ENT as pt does report some fullness in R ear and behind his eyes? Pt in agreement with plan.     OBJECTIVE IMPAIRMENTS: dizziness.   ACTIVITY LIMITATIONS: bending, bed mobility, locomotion level, and caring for others  PARTICIPATION LIMITATIONS: interpersonal relationship, driving, shopping, community activity, and occupation  PERSONAL FACTORS: Age, Past/current experiences, and Time since onset of injury/illness/exacerbation are also affecting patient's functional outcome.   REHAB POTENTIAL: Fair unknown etiology  CLINICAL DECISION MAKING: Unstable/unpredictable  EVALUATION COMPLEXITY: High   PLAN:  PT FREQUENCY: 1-2x/week  PT DURATION: 4 weeks  PLANNED INTERVENTIONS: 97164- PT Re-evaluation, 97750- Physical Performance Testing, 97110-Therapeutic exercises, 97530- Therapeutic activity, [id number]- Neuromuscular re-education, 97535- Self Care, [id number]- Manual therapy, [id number]- Gait training, [phone]- Orthotic Initial, [phone]- Orthotic/Prosthetic subsequent, [phone]- Canalith repositioning, [phone]- Aquatic Therapy, Patient/Family education, Balance training, Stair training, Vestibular training, Visual/preceptual remediation/compensation, Cognitive remediation, and DME instructions  PLAN FOR NEXT SESSION: D/C, vestib exam normal    Seabron Cypress, PT, DPT  10/22/2023, 3:25 PM

## 2023-11-26 DIAGNOSIS — H34832 Tributary (branch) retinal vein occlusion, left eye, with macular edema: Secondary | ICD-10-CM | POA: Diagnosis not present

## 2023-12-10 DIAGNOSIS — E113393 Type 2 diabetes mellitus with moderate nonproliferative diabetic retinopathy without macular edema, bilateral: Secondary | ICD-10-CM | POA: Diagnosis not present

## 2023-12-21 DIAGNOSIS — H9203 Otalgia, bilateral: Secondary | ICD-10-CM | POA: Diagnosis not present

## 2024-01-08 DIAGNOSIS — H34832 Tributary (branch) retinal vein occlusion, left eye, with macular edema: Secondary | ICD-10-CM | POA: Diagnosis not present

## 2024-01-11 DIAGNOSIS — E782 Mixed hyperlipidemia: Secondary | ICD-10-CM | POA: Diagnosis not present

## 2024-01-11 DIAGNOSIS — I10 Essential (primary) hypertension: Secondary | ICD-10-CM | POA: Diagnosis not present

## 2024-01-11 DIAGNOSIS — E119 Type 2 diabetes mellitus without complications: Secondary | ICD-10-CM | POA: Diagnosis not present

## 2024-01-11 DIAGNOSIS — I2510 Atherosclerotic heart disease of native coronary artery without angina pectoris: Secondary | ICD-10-CM | POA: Diagnosis not present

## 2024-02-25 DIAGNOSIS — E119 Type 2 diabetes mellitus without complications: Secondary | ICD-10-CM | POA: Diagnosis not present

## 2024-02-25 DIAGNOSIS — H43812 Vitreous degeneration, left eye: Secondary | ICD-10-CM | POA: Diagnosis not present

## 2024-02-25 DIAGNOSIS — H43821 Vitreomacular adhesion, right eye: Secondary | ICD-10-CM | POA: Diagnosis not present

## 2024-02-25 DIAGNOSIS — H34832 Tributary (branch) retinal vein occlusion, left eye, with macular edema: Secondary | ICD-10-CM | POA: Diagnosis not present

## 2024-02-25 DIAGNOSIS — Z961 Presence of intraocular lens: Secondary | ICD-10-CM | POA: Diagnosis not present

## 2024-02-25 DIAGNOSIS — H35033 Hypertensive retinopathy, bilateral: Secondary | ICD-10-CM | POA: Diagnosis not present

## 2024-03-19 DIAGNOSIS — K5909 Other constipation: Secondary | ICD-10-CM | POA: Diagnosis not present

## 2024-03-19 DIAGNOSIS — E7800 Pure hypercholesterolemia, unspecified: Secondary | ICD-10-CM | POA: Diagnosis not present

## 2024-03-19 DIAGNOSIS — R42 Dizziness and giddiness: Secondary | ICD-10-CM | POA: Diagnosis not present

## 2024-03-19 DIAGNOSIS — I2510 Atherosclerotic heart disease of native coronary artery without angina pectoris: Secondary | ICD-10-CM | POA: Diagnosis not present

## 2024-03-19 DIAGNOSIS — K219 Gastro-esophageal reflux disease without esophagitis: Secondary | ICD-10-CM | POA: Diagnosis not present

## 2024-03-19 DIAGNOSIS — Z125 Encounter for screening for malignant neoplasm of prostate: Secondary | ICD-10-CM | POA: Diagnosis not present

## 2024-03-19 DIAGNOSIS — Z79899 Other long term (current) drug therapy: Secondary | ICD-10-CM | POA: Diagnosis not present

## 2024-03-19 DIAGNOSIS — K9089 Other intestinal malabsorption: Secondary | ICD-10-CM | POA: Diagnosis not present

## 2024-03-19 DIAGNOSIS — E1169 Type 2 diabetes mellitus with other specified complication: Secondary | ICD-10-CM | POA: Diagnosis not present

## 2024-03-19 DIAGNOSIS — D696 Thrombocytopenia, unspecified: Secondary | ICD-10-CM | POA: Diagnosis not present

## 2024-03-19 DIAGNOSIS — Z0000 Encounter for general adult medical examination without abnormal findings: Secondary | ICD-10-CM | POA: Diagnosis not present

## 2024-03-31 DIAGNOSIS — H43812 Vitreous degeneration, left eye: Secondary | ICD-10-CM | POA: Diagnosis not present

## 2024-03-31 DIAGNOSIS — H34832 Tributary (branch) retinal vein occlusion, left eye, with macular edema: Secondary | ICD-10-CM | POA: Diagnosis not present

## 2024-03-31 DIAGNOSIS — E119 Type 2 diabetes mellitus without complications: Secondary | ICD-10-CM | POA: Diagnosis not present

## 2024-03-31 DIAGNOSIS — H35033 Hypertensive retinopathy, bilateral: Secondary | ICD-10-CM | POA: Diagnosis not present

## 2024-03-31 DIAGNOSIS — H43821 Vitreomacular adhesion, right eye: Secondary | ICD-10-CM | POA: Diagnosis not present

## 2024-03-31 DIAGNOSIS — Z961 Presence of intraocular lens: Secondary | ICD-10-CM | POA: Diagnosis not present
